# Patient Record
Sex: Female | Born: 1980 | Race: Black or African American | Hispanic: No | Marital: Married | Smoking: Never smoker
Health system: Southern US, Community
[De-identification: ages and names within clinical notes are randomized; demographics above are authoritative.]

## PROBLEM LIST (undated history)

## (undated) DIAGNOSIS — R319 Hematuria, unspecified: Secondary | ICD-10-CM

## (undated) DIAGNOSIS — M329 Systemic lupus erythematosus, unspecified: Secondary | ICD-10-CM

## (undated) DIAGNOSIS — K589 Irritable bowel syndrome without diarrhea: Secondary | ICD-10-CM

## (undated) DIAGNOSIS — IMO0002 Reserved for concepts with insufficient information to code with codable children: Secondary | ICD-10-CM

## (undated) HISTORY — PX: OOPHORECTOMY: SHX86

## (undated) HISTORY — PX: CHOLECYSTECTOMY: SHX55

## (undated) HISTORY — PX: ABDOMINAL HYSTERECTOMY: SHX81

---

## 2021-07-15 ENCOUNTER — Emergency Department (HOSPITAL_BASED_OUTPATIENT_CLINIC_OR_DEPARTMENT_OTHER): Payer: Self-pay

## 2021-07-15 ENCOUNTER — Emergency Department (HOSPITAL_BASED_OUTPATIENT_CLINIC_OR_DEPARTMENT_OTHER)
Admission: EM | Admit: 2021-07-15 | Discharge: 2021-07-16 | Disposition: A | Discharge status: Home / Self Care | Payer: Self-pay | Attending: Emergency Medicine | Admitting: Emergency Medicine

## 2021-07-15 ENCOUNTER — Other Ambulatory Visit: Payer: Self-pay

## 2021-07-15 ENCOUNTER — Encounter (HOSPITAL_BASED_OUTPATIENT_CLINIC_OR_DEPARTMENT_OTHER): Payer: Self-pay | Admitting: Emergency Medicine

## 2021-07-15 DIAGNOSIS — N3001 Acute cystitis with hematuria: Secondary | ICD-10-CM | POA: Insufficient documentation

## 2021-07-15 DIAGNOSIS — M329 Systemic lupus erythematosus, unspecified: Secondary | ICD-10-CM | POA: Insufficient documentation

## 2021-07-15 HISTORY — DX: Systemic lupus erythematosus, unspecified: M32.9

## 2021-07-15 HISTORY — DX: Reserved for concepts with insufficient information to code with codable children: IMO0002

## 2021-07-15 HISTORY — DX: Irritable bowel syndrome, unspecified: K58.9

## 2021-07-15 LAB — COMPREHENSIVE METABOLIC PANEL
ALT: 19 U/L (ref 0–44)
AST: 24 U/L (ref 15–41)
Albumin: 3.9 g/dL (ref 3.5–5.0)
Alkaline Phosphatase: 64 U/L (ref 38–126)
Anion gap: 9 (ref 5–15)
BUN: 7 mg/dL (ref 6–20)
CO2: 24 mmol/L (ref 22–32)
Calcium: 9.5 mg/dL (ref 8.9–10.3)
Chloride: 105 mmol/L (ref 98–111)
Creatinine, Ser: 0.8 mg/dL (ref 0.44–1.00)
GFR, Estimated: >60 mL/min (ref >60)
Glucose, Bld: 126 mg/dL — ABNORMAL HIGH (ref 70–99)
Potassium: 3.6 mmol/L (ref 3.5–5.1)
Sodium: 138 mmol/L (ref 135–145)
Total Bilirubin: 0.5 mg/dL (ref 0.3–1.2)
Total Protein: 8.4 g/dL — ABNORMAL HIGH (ref 6.5–8.1)

## 2021-07-15 LAB — CBC
HCT: 40.9 % (ref 36.0–46.0)
Hemoglobin: 14.8 g/dL (ref 12.0–15.0)
MCH: 29.2 pg (ref 26.0–34.0)
MCHC: 36.2 g/dL — ABNORMAL HIGH (ref 30.0–36.0)
MCV: 80.8 fL (ref 80.0–100.0)
Platelets: 401 10*3/uL — ABNORMAL HIGH (ref 150–400)
RBC: 5.06 MIL/uL (ref 3.87–5.11)
RDW: 14.6 % (ref 11.5–15.5)
WBC: 10 10*3/uL (ref 4.0–10.5)
nRBC: 0 % (ref 0.0–0.2)

## 2021-07-15 LAB — LIPASE, BLOOD: Lipase: 30 U/L (ref 11–51)

## 2021-07-15 MED ORDER — SODIUM CHLORIDE 0.9 % IV BOLUS
1000.0000 mL | Freq: Once | INTRAVENOUS | Status: AC
  Administered 2021-07-15: 1000 mL via INTRAVENOUS

## 2021-07-15 MED ORDER — SODIUM CHLORIDE 0.9 % IV BOLUS
1000.0000 mL | Freq: Once | INTRAVENOUS | Status: AC
  Administered 2021-07-16: 1000 mL via INTRAVENOUS

## 2021-07-15 MED ORDER — ONDANSETRON HCL 4 MG/2ML IJ SOLN
4.0000 mg | Freq: Once | INTRAMUSCULAR | Status: AC
  Administered 2021-07-16: 4 mg via INTRAVENOUS
  Filled 2021-07-15: qty 2

## 2021-07-15 MED ORDER — FENTANYL CITRATE PF 50 MCG/ML IJ SOSY
100.0000 ug | PREFILLED_SYRINGE | Freq: Once | INTRAMUSCULAR | Status: AC
  Administered 2021-07-16: 100 ug via INTRAVENOUS
  Filled 2021-07-15: qty 2

## 2021-07-15 NOTE — ED Triage Notes (Signed)
Reports she is having a lupus flair for the last four days.  Having  blood in urine.  Pain in joints, abdomen, and low back.  Taking tylenol and ibuprofen at home with no relief. ?

## 2021-07-16 ENCOUNTER — Telehealth: Payer: Self-pay

## 2021-07-16 LAB — URINALYSIS, ROUTINE W REFLEX MICROSCOPIC
Glucose, UA: NEGATIVE mg/dL
Ketones, ur: 15 mg/dL — AB
Nitrite: POSITIVE — AB
Protein, ur: >=300 mg/dL — AB
Specific Gravity, Urine: 1.015 (ref 1.005–1.030)
pH: 5 (ref 5.0–8.0)

## 2021-07-16 LAB — URINALYSIS, MICROSCOPIC (REFLEX): RBC / HPF: >50 RBC/hpf (ref 0–5)

## 2021-07-16 LAB — CK: Total CK: 52 U/L (ref 38–234)

## 2021-07-16 MED ORDER — CIPROFLOXACIN HCL 500 MG PO TABS
500.0000 mg | ORAL_TABLET | Freq: Two times a day (BID) | ORAL | 0 refills | Status: DC
Start: 2021-07-16 — End: 2021-09-24

## 2021-07-16 MED ORDER — CIPROFLOXACIN HCL 500 MG PO TABS
500.0000 mg | ORAL_TABLET | Freq: Once | ORAL | Status: AC
  Administered 2021-07-16: 500 mg via ORAL
  Filled 2021-07-16: qty 1

## 2021-07-16 MED ORDER — OXYCODONE-ACETAMINOPHEN 5-325 MG PO TABS
1.0000 | ORAL_TABLET | Freq: Once | ORAL | Status: AC
  Administered 2021-07-16: 1 via ORAL
  Filled 2021-07-16: qty 1

## 2021-07-16 MED ORDER — OXYCODONE-ACETAMINOPHEN 5-325 MG PO TABS: 1.0000 | ORAL_TABLET | Freq: Three times a day (TID) | ORAL | 0 refills | Status: DC | PRN

## 2021-07-16 NOTE — ED Provider Notes (Signed)
?[facility] EMERGENCY DEPARTMENT ?Provider Note ? ? ?CSN: [id number] ?Arrival date & time: 07/15/21  2042 ? ?  ? ?History ? ?Chief complaint - pain and weakness ? ? ?Amy Mathis is a 41 y.o. female. ? ?HPI ? ?  ?Patient with history of lupus and Crohn's presents with diffuse body pain and generalized weakness.  Patient reports she is having a lupus flare.  She reports for the past 4 days she has had generalized body aches and fatigue.  She reports joint joint pain without swelling.  No fevers.  She has had nausea with nonbloody emesis.  She also reports chronic diarrhea.  No chest pain but has felt short of breath.  This feels similar to prior episodes of a flare.  She also reports hematuria.  She reports she recently relocated here from [place].  Last admission to the hospital was over 6 months ago she reports she underwent a kidney biopsy ?Her only meds are Plaquenil and prednisone ?Home Medications ?Prior to Admission medications   ?Medication Sig Start Date End Date Taking? Authorizing Provider  ?ciprofloxacin (CIPRO) 500 MG tablet Take 1 tablet (500 mg total) by mouth every 12 (twelve) hours. 07/16/21  Yes Zadie Rhine, MD  ?oxyCODONE-acetaminophen (PERCOCET/ROXICET) 5-325 MG tablet Take 1 tablet by mouth every 8 (eight) hours as needed for severe pain. 07/16/21  Yes Zadie Rhine, MD  ?   ? ?Allergies    ?Morphine and Penicillins   ? ?Review of Systems   ?Review of Systems  ?Constitutional:  Positive for fatigue. Negative for fever.  ?Respiratory:  Positive for shortness of breath.   ?Cardiovascular:  Negative for chest pain.  ?Gastrointestinal:  Positive for nausea and vomiting.  ?Genitourinary:  Positive for hematuria.  ?Musculoskeletal:  Positive for arthralgias, back pain and myalgias.  ? ?Physical Exam ?Updated Vital Signs ?BP (!) 130/95   Pulse 79   Temp 98 ?F (36.7 ?C) (Oral)   Resp 14   Ht 1.803 m (5\' 11" )   Wt (!) 149.7 kg   SpO2 97%   BMI 46.03 kg/m?  ?Physical  Exam ?CONSTITUTIONAL: Well developed/well nourished ?HEAD: Normocephalic/atraumatic ?EYES: EOMI/PERRL ?ENMT: Mucous membranes moist ?NECK: supple no meningeal signs ?SPINE/BACK:entire spine nontender ?CV: S1/S2 noted, no murmurs/rubs/gallops noted ?LUNGS: Lungs are clear to auscultation bilaterally, no apparent distress ?ABDOMEN: soft, nontender, no rebound or guarding, bowel sounds noted throughout abdomen ?GU:no cva tenderness ?NEURO: Pt is awake/alert/appropriate, moves all extremitiesx4.  No facial droop.  No arm or leg drift ?EXTREMITIES: pulses normal/equal, full ROM ?SKIN: warm, color normal ?PSYCH: no abnormalities of mood noted, alert and oriented to situation ? ?ED Results / Procedures / Treatments   ?Labs ?(all labs ordered are listed, but only abnormal results are displayed) ?Labs Reviewed  ?COMPREHENSIVE METABOLIC PANEL - Abnormal; Notable for the following components:  ?    Result Value  ? Glucose, Bld 126 (*)   ? Total Protein 8.4 (*)   ? All other components within normal limits  ?CBC - Abnormal; Notable for the following components:  ? MCHC 36.2 (*)   ? Platelets 401 (*)   ? All other components within normal limits  ?URINALYSIS, ROUTINE W REFLEX MICROSCOPIC - Abnormal; Notable for the following components:  ? Color, Urine RED (*)   ? APPearance TURBID (*)   ? Hgb urine dipstick LARGE (*)   ? Bilirubin Urine MODERATE (*)   ? Ketones, ur 15 (*)   ? Protein, ur >=300 (*)   ? Nitrite POSITIVE (*)   ?  Leukocytes,Ua LARGE (*)   ? All other components within normal limits  ?URINALYSIS, MICROSCOPIC (REFLEX) - Abnormal; Notable for the following components:  ? Bacteria, UA MANY (*)   ? All other components within normal limits  ?URINE CULTURE  ?LIPASE, BLOOD  ?CK  ? ? ?EKG ?EKG Interpretation ? ?Date/Time:  Sunday July 15 2021 23:54:31 EDT ?Ventricular Rate:  86 ?PR Interval:  141 ?QRS Duration: 78 ?QT Interval:  363 ?QTC Calculation: 435 ?R Axis:   13 ?Text Interpretation: Sinus rhythm Low voltage,  precordial leads Borderline T abnormalities, anterior leads No previous ECGs available Confirmed by Zadie Rhine ([id number]) on 07/16/2021 12:06:39 AM ? ?Radiology ?DG Chest Portable 1 View ? ?Result Date: 07/16/2021 ?CLINICAL DATA:  Lupus flare x4 days. EXAM: PORTABLE CHEST 1 VIEW COMPARISON:  None. FINDINGS: The heart size and mediastinal contours are within normal limits. Both lungs are clear. The visualized skeletal structures are unremarkable. IMPRESSION: No active disease. Electronically Signed   By: Aram Candela M.D.   On: 07/16/2021 00:09   ? ?Procedures ?Procedures  ? ? ?Medications Ordered in ED ?Medications  ?oxyCODONE-acetaminophen (PERCOCET/ROXICET) 5-325 MG per tablet 1 tablet (has no administration in time range)  ?sodium chloride 0.9 % bolus 1,000 mL (1,000 mLs Intravenous New Bag/Given 07/16/21 0101)  ?fentaNYL (SUBLIMAZE) injection 100 mcg (100 mcg Intravenous Given 07/16/21 0008)  ?ondansetron [facility]) injection 4 mg (4 mg Intravenous Given 07/16/21 0007)  ?sodium chloride 0.9 % bolus 1,000 mL (1,000 mLs Intravenous New Bag/Given 07/15/21 2353)  ?ciprofloxacin (CIPRO) tablet 500 mg (500 mg Oral Given 07/16/21 0125)  ? ? ?ED Course/ Medical Decision Making/ A&P ?Clinical Course as of 07/16/21 0145  ?Sun Jul 15, 2021  ?2336 Glucose(!): 126 ?Mild hyperglycemia [DW]  ?Mon Jul 16, 2021  ?1308 Patient feeling improved and is ambulatory, labs pending at this time [DW]  ?6578 Patient with evidence of urinary tract infection.  However she is in no acute distress.  She is afebrile and without signs of sepsis.  No vomiting here.  She is safe for outpatient management [DW]  ?0145 Will be given referrals to PCP as she just relocated to [facility] and has multiple chronic conditions [DW]  ?0145 We discussed strict ER return precautions [DW]  ?0145 Patient request short course of pain medications for her lupus flare.  Patient has full range of motion of all her extremities, no obvious edema or effusions noted [DW]  ?   ?Clinical Course User Index ?[DW] Zadie Rhine, MD  ? ?                        ?Medical Decision Making ?Amount and/or Complexity of Data Reviewed ?Labs: ordered. Decision-making details documented in ED Course. ?Radiology: ordered. ?ECG/medicine tests: ordered. ? ?Risk ?Prescription drug management. ? ? ?This patient presents to the ED for concern of lupus flare, this involves an extensive number of treatment options, and is a complaint that carries with it a high risk of complications and morbidity.  The differential diagnosis includes but is not limited to dehydration, UTI, rhabdomyolysis ? ?Comorbidities that complicate the patient evaluation: ?Patient?s presentation is complicated by their history of lupus and Crohn's ? ?Social Determinants of Health: ?Patient?s lack of insurance and impaired access to primary care  increases the complexity of managing their presentation ? ?Additional history obtained: ?Additional history obtained from spouse ?No previous records and found ? ?Lab Tests: ?I Ordered, and personally interpreted labs.  The pertinent results include: hyper glycemia ? ?  Imaging Studies ordered: ?I ordered imaging studies including X-ray chest   ?I independently visualized and interpreted imaging which showed no acute findings ?I agree with the radiologist interpretation ? ?Cardiac Monitoring: ?The patient was maintained on a cardiac monitor.  I personally viewed and interpreted the cardiac monitor which showed an underlying rhythm of:  sinus rhythm ? ?Medicines ordered and prescription drug management: ?I ordered medication including IV fentanyl for pain ?Reevaluation of the patient after these medicines showed that the patient    improved ? ?Critical Interventions: ? ?Antibiotics and fluids ? ?Consultations Obtained: ?Consulted transitions of care to assist with PCP follow-up ? ?Reevaluation: ?After the interventions noted above, I reevaluated the patient and found that they have  :improved ? ?Complexity of problems addressed: ?Patient?s presentation is most consistent with  acute presentation with potential threat to life or bodily function ? ?Disposition: ?After consideration of the diagnostic results and t

## 2021-07-16 NOTE — Telephone Encounter (Signed)
RNCM spoke with patient re: need for PCP. Patient is new to area was seeing PCP in [place]. Patient is available anytime and has no insurance. RNCM will follow up tomorrow after 11am. ?

## 2021-07-17 ENCOUNTER — Telehealth: Payer: Self-pay

## 2021-07-17 LAB — URINE CULTURE: Culture: NO GROWTH

## 2021-07-17 NOTE — Telephone Encounter (Signed)
RNCM called patient to advise of new PCP appt scheduled 07/23/21 at 9:45am with $25 copay at [facility]. RNCM received no answer and unable to leave voicemail due to vm box has not been set up. RNCM will try again later. ?

## 2021-07-23 ENCOUNTER — Encounter: Payer: Self-pay | Admitting: Internal Medicine

## 2021-09-23 ENCOUNTER — Emergency Department (HOSPITAL_BASED_OUTPATIENT_CLINIC_OR_DEPARTMENT_OTHER)
Admission: EM | Admit: 2021-09-23 | Discharge: 2021-09-23 | Disposition: A | Discharge status: Home / Self Care | Payer: Self-pay | Attending: Emergency Medicine | Admitting: Emergency Medicine

## 2021-09-23 ENCOUNTER — Encounter (HOSPITAL_BASED_OUTPATIENT_CLINIC_OR_DEPARTMENT_OTHER): Payer: Self-pay | Admitting: Emergency Medicine

## 2021-09-23 ENCOUNTER — Emergency Department (HOSPITAL_BASED_OUTPATIENT_CLINIC_OR_DEPARTMENT_OTHER): Payer: Self-pay

## 2021-09-23 ENCOUNTER — Other Ambulatory Visit: Payer: Self-pay

## 2021-09-23 DIAGNOSIS — S63502A Unspecified sprain of left wrist, initial encounter: Secondary | ICD-10-CM | POA: Insufficient documentation

## 2021-09-23 DIAGNOSIS — W010XXA Fall on same level from slipping, tripping and stumbling without subsequent striking against object, initial encounter: Secondary | ICD-10-CM | POA: Insufficient documentation

## 2021-09-23 DIAGNOSIS — W19XXXA Unspecified fall, initial encounter: Secondary | ICD-10-CM

## 2021-09-23 MED ORDER — OXYCODONE-ACETAMINOPHEN 5-325 MG PO TABS
1.0000 | ORAL_TABLET | ORAL | Status: AC | PRN
  Administered 2021-09-23 (×2): 1 via ORAL
  Filled 2021-09-23 (×2): qty 1

## 2021-09-23 NOTE — ED Provider Notes (Signed)
  [facility] EMERGENCY DEPARTMENT Provider Note   CSN: [id number] Arrival date & time: 09/23/21  1618     History {Add pertinent medical, surgical, social history, OB history to HPI:1} Chief Complaint  Patient presents with   Amy Mathis is a 41 y.o. female.   Fall       Home Medications Prior to Admission medications   Medication Sig Start Date End Date Taking? Authorizing Provider  ciprofloxacin (CIPRO) 500 MG tablet Take 1 tablet (500 mg total) by mouth every 12 (twelve) hours. 07/16/21   Zadie Rhine, MD  oxyCODONE-acetaminophen (PERCOCET/ROXICET) 5-325 MG tablet Take 1 tablet by mouth every 8 (eight) hours as needed for severe pain. 07/16/21   Zadie Rhine, MD      Allergies    Morphine and Penicillins    Review of Systems   Review of Systems  Physical Exam Updated Vital Signs BP (!) 150/124 (BP Location: Right Arm)   Pulse (!) 111   Temp 99.6 F (37.6 C) (Oral)   Resp 18   Ht 5\' 10"  (1.778 m)   Wt 97.5 kg   SpO2 100%   BMI 30.85 kg/m  Physical Exam  ED Results / Procedures / Treatments   Labs (all labs ordered are listed, but only abnormal results are displayed) Labs Reviewed - No data to display  EKG None  Radiology DG Wrist Complete Left  Result Date: 09/23/2021 CLINICAL DATA:  Fall with left wrist pain. EXAM: LEFT WRIST - COMPLETE 3+ VIEW COMPARISON:  None Available. FINDINGS: There is no evidence of fracture or dislocation. There is no evidence of arthropathy or other focal bone abnormality. Soft tissues are unremarkable. IMPRESSION: Negative. Electronically Signed   By: 11/24/2021 M.D.   On: 09/23/2021 16:51    Procedures Procedures  {Document cardiac monitor, telemetry assessment procedure when appropriate:1}  Medications Ordered in ED Medications  oxyCODONE-acetaminophen (PERCOCET/ROXICET) 5-325 MG per tablet 1 tablet (1 tablet Oral Given 09/23/21 1632)    ED Course/ Medical Decision Making/ A&P                            Medical Decision Making Amount and/or Complexity of Data Reviewed Radiology: ordered.  Risk Prescription drug management.   ***  {Document critical care time when appropriate:1} {Document review of labs and clinical decision tools ie heart score, Chads2Vasc2 etc:1}  {Document your independent review of radiology images, and any outside records:1} {Document your discussion with family members, caretakers, and with consultants:1} {Document social determinants of health affecting pt's care:1} {Document your decision making why or why not admission, treatments were needed:1} Final Clinical Impression(s) / ED Diagnoses Final diagnoses:  Sprain of left wrist, initial encounter  Fall, initial encounter    Rx / DC Orders ED Discharge Orders     None

## 2021-09-23 NOTE — ED Triage Notes (Signed)
Pt arrives pov, steady gait, c/o mechanical fall 1 hr pta. Pt c/o left wrist pain, swelling noted Took tylenol @ 1500, and ibuprofen after fall 1 hr pta.

## 2021-09-23 NOTE — Discharge Instructions (Addendum)
You are seen the emergency department today after fall with wrist pain.  As we discussed your x-ray did not show any fractures.  I think that you likely sprained your wrist.  We have placed a splint on this.  We have given you a dose of some pain medication.  I recommend continuing ice and over-the-counter medications at home.  I have attached the contact information for the orthopedic doctor, for you to call and make a follow-up appointment if your symptoms do not improve.

## 2021-09-24 ENCOUNTER — Ambulatory Visit
Admission: EM | Admit: 2021-09-24 | Discharge: 2021-09-24 | Disposition: A | Discharge status: Home / Self Care | Payer: Self-pay

## 2021-09-24 DIAGNOSIS — S6992XD Unspecified injury of left wrist, hand and finger(s), subsequent encounter: Secondary | ICD-10-CM

## 2021-09-24 MED ORDER — DICLOFENAC SODIUM 1 % EX GEL: 4.0000 g | Freq: Four times a day (QID) | CUTANEOUS | 2 refills | Status: DC

## 2021-09-24 NOTE — Discharge Instructions (Signed)
Please continue Tylenol 1000 mg 3 times daily and begin Voltaren gel, applied 4 times daily as needed for pain.  Keep your wrist brace on and consider following up with [facility] at [facility] walk[facility] clinic.  They do not accept appointments for the urgent care clinic you does have to go in person, they are open from 8 AM to 8 PM Monday through Friday.  Thank you for visiting urgent care.

## 2021-09-24 NOTE — ED Triage Notes (Signed)
Pt states she has a fall yesterday (our of [employer] while wearing crocks) the patient states she hurt her left wrist. The patient has a brace around the wrist currently. She was seen in the ED yesterday. Patient states she has IBS so she cannot take NSAIDS.

## 2021-09-25 NOTE — ED Provider Notes (Signed)
[facility]    CSN: [id number] Arrival date & time: 09/24/21  1700    HISTORY   Chief Complaint  Patient presents with   Wrist Pain   HPI Amy Mathis is a 41 y.o. female. Patient states that she hurt her wrist yesterday after falling out of a [employer] while wearing crocs.  Patient states she went to the emergency room and was advised to take NSAIDs for pain, patient states she has lupus and IBS and cannot tolerate NSAIDs.  Patient states she is in a lot of pain today.  The emergency room provider placed her in a wrist splint and advised her that her wrist x-ray was negative.  Patient states she has fallen similarly in the past and broken her wrist before, believes it is broken at this time.  The history is provided by the patient.   Past Medical History:  Diagnosis Date   IBS (irritable bowel syndrome)    Lupus (HCC)    There are no problems to display for this patient.  Past Surgical History:  Procedure Laterality Date   ABDOMINAL HYSTERECTOMY     CHOLECYSTECTOMY     OOPHORECTOMY     OB History   No obstetric history on file.    Home Medications    Prior to Admission medications   Medication Sig Start Date End Date Taking? Authorizing Provider  diclofenac Sodium (VOLTAREN) 1 % GEL Apply 4 g topically 4 (four) times daily. Apply to affected areas 4 times daily as needed for pain. 09/24/21  Yes Theadora Rama Scales, PA-C  hydroxychloroquine (PLAQUENIL) 200 MG tablet Take by mouth 2 (two) times daily.   Yes [provider]  predniSONE (DELTASONE) 50 MG tablet Take 50 mg by mouth daily with breakfast.   Yes [provider]    Family History History reviewed. No pertinent family history. Social History Social History   Tobacco Use   Smoking status: Never   Smokeless tobacco: Never  Substance Use Topics   Alcohol use: Never   Drug use: Never   Allergies   Morphine and Penicillins  Review of Systems Review of Systems Pertinent  findings noted in history of present illness.   Physical Exam Triage Vital Signs ED Triage Vitals  Enc Vitals Group     BP 01/12/21 0827 (!) 147/82     Pulse Rate 01/12/21 0827 72     Resp 01/12/21 0827 18     Temp 01/12/21 0827 98.3 F (36.8 C)     Temp Source 01/12/21 0827 Oral     SpO2 01/12/21 0827 98 %     Weight --      Height --      Head Circumference --      Peak Flow --      Pain Score 01/12/21 0826 5     Pain Loc --      Pain Edu? --      Excl. in GC? --   No data found.  Updated Vital Signs BP 127/83 (BP Location: Right Arm)   Pulse 91   Temp 99 F (37.2 C) (Oral)   Resp 18   SpO2 97%   Physical Exam Vitals and nursing note reviewed.  Constitutional:      Appearance: Normal appearance.  HENT:     Head: Normocephalic and atraumatic.  Eyes:     Conjunctiva/sclera: Conjunctivae normal.  Pulmonary:     Effort: Pulmonary effort is normal. No respiratory distress.  Musculoskeletal:  Comments: Soft tissue swelling noted over the left wrist with limited ROM due to pain. Pain with thumb adduction. Able to wiggle other digits. Normal capillary refill.   Skin:    General: Skin is warm and dry.     Capillary Refill: Capillary refill takes less than 2 seconds.  Neurological:     Mental Status: She is alert.  Psychiatric:        Mood and Affect: Mood normal.        Behavior: Behavior normal.     Visual Acuity Right Eye Distance:   Left Eye Distance:   Bilateral Distance:    Right Eye Near:   Left Eye Near:    Bilateral Near:     [facility] Couse / Diagnostics / Procedures:    EKG  Radiology DG Wrist Complete Left  Result Date: 09/23/2021 CLINICAL DATA:  Fall with left wrist pain. EXAM: LEFT WRIST - COMPLETE 3+ VIEW COMPARISON:  None Available. FINDINGS: There is no evidence of fracture or dislocation. There is no evidence of arthropathy or other focal bone abnormality. Soft tissues are unremarkable. IMPRESSION: Negative. Electronically Signed   By: Kennith Center M.D.   On: 09/23/2021 16:51    Procedures Procedures (including critical care time)  UC Diagnoses / Final Clinical Impressions(s)   I have reviewed the triage vital signs and the nursing notes.  Pertinent labs & imaging results that were available during my care of the patient were reviewed by me and considered in my medical decision making (see chart for details).    Final diagnoses:  Injury of left wrist, subsequent encounter   Patient given contact information for [facility].  Patient provided with a prescription for diclofenac gel if she chooses not to go this evening.  ED Prescriptions     Medication Sig Dispense Auth. Provider   diclofenac Sodium (VOLTAREN) 1 % GEL Apply 4 g topically 4 (four) times daily. Apply to affected areas 4 times daily as needed for pain. 100 g Theadora Rama Scales, PA-C      PDMP not reviewed this encounter.  Pending results:  Labs Reviewed - No data to display  Medications Ordered in UC: Medications - No data to display  Disposition Upon Discharge:  Condition: stable for discharge home Home: take medications as prescribed; routine discharge instructions as discussed; follow up as advised.  Patient presented with an acute illness with associated systemic symptoms and significant discomfort requiring urgent management. In my opinion, this is a condition that a prudent lay person (someone who possesses an average knowledge of health and medicine) may potentially expect to result in complications if not addressed urgently such as respiratory distress, impairment of bodily function or dysfunction of bodily organs.   Routine symptom specific, illness specific and/or disease specific instructions were discussed with the patient and/or caregiver at length.   As such, the patient has been evaluated and assessed, work-up was performed and treatment was provided in alignment with urgent care protocols and evidence based  medicine.  Patient/parent/caregiver has been advised that the patient may require follow up for further testing and treatment if the symptoms continue in spite of treatment, as clinically indicated and appropriate.  Patient/parent/caregiver has been advised to return to the [facility] or PCP if no better; to PCP or the Emergency Department if new signs and symptoms develop, or if the current signs or symptoms continue to change or worsen for further workup, evaluation and treatment as clinically indicated and appropriate  The patient  will follow up with their current PCP if and as advised. If the patient does not currently have a PCP we will assist them in obtaining one.   The patient may need specialty follow up if the symptoms continue, in spite of conservative treatment and management, for further workup, evaluation, consultation and treatment as clinically indicated and appropriate.   Patient/parent/caregiver verbalized understanding and agreement of plan as discussed.  All questions were addressed during visit.  Please see discharge instructions below for further details of plan.  Discharge Instructions:   Discharge Instructions      Please continue Tylenol 1000 mg 3 times daily and begin Voltaren gel, applied 4 times daily as needed for pain.  Keep your wrist brace on and consider following up with [facility] at [facility] walk-[facility] clinic.  They do not accept appointments for the urgent care clinic you does have to go in person, they are open from 8 AM to 8 PM Monday through Friday.  Thank you for visiting urgent care.    This office note has been dictated using Museum/gallery curator.  Unfortunately, and despite my best efforts, this method of dictation can sometimes lead to occasional typographical or grammatical errors.  I apologize in advance if this occurs.     Lynden Oxford Scales, [place] 09/25/21 [phone]

## 2021-10-27 ENCOUNTER — Emergency Department (HOSPITAL_BASED_OUTPATIENT_CLINIC_OR_DEPARTMENT_OTHER)
Admission: EM | Admit: 2021-10-27 | Discharge: 2021-10-27 | Disposition: A | Discharge status: Home / Self Care | Payer: Self-pay | Attending: Emergency Medicine | Admitting: Emergency Medicine

## 2021-10-27 ENCOUNTER — Encounter (HOSPITAL_BASED_OUTPATIENT_CLINIC_OR_DEPARTMENT_OTHER): Payer: Self-pay | Admitting: Emergency Medicine

## 2021-10-27 ENCOUNTER — Other Ambulatory Visit: Payer: Self-pay

## 2021-10-27 DIAGNOSIS — L932 Other local lupus erythematosus: Secondary | ICD-10-CM | POA: Insufficient documentation

## 2021-10-27 DIAGNOSIS — E876 Hypokalemia: Secondary | ICD-10-CM | POA: Insufficient documentation

## 2021-10-27 DIAGNOSIS — R109 Unspecified abdominal pain: Secondary | ICD-10-CM | POA: Insufficient documentation

## 2021-10-27 DIAGNOSIS — R310 Gross hematuria: Secondary | ICD-10-CM | POA: Insufficient documentation

## 2021-10-27 LAB — COMPREHENSIVE METABOLIC PANEL
ALT: 16 U/L (ref 0–44)
AST: 18 U/L (ref 15–41)
Albumin: 3.9 g/dL (ref 3.5–5.0)
Alkaline Phosphatase: 57 U/L (ref 38–126)
Anion gap: 7 (ref 5–15)
BUN: 7 mg/dL (ref 6–20)
CO2: 27 mmol/L (ref 22–32)
Calcium: 9 mg/dL (ref 8.9–10.3)
Chloride: 103 mmol/L (ref 98–111)
Creatinine, Ser: 0.68 mg/dL (ref 0.44–1.00)
GFR, Estimated: >60 mL/min (ref >60)
Glucose, Bld: 93 mg/dL (ref 70–99)
Potassium: 3.2 mmol/L — ABNORMAL LOW (ref 3.5–5.1)
Sodium: 137 mmol/L (ref 135–145)
Total Bilirubin: 0.3 mg/dL (ref 0.3–1.2)
Total Protein: 8.5 g/dL — ABNORMAL HIGH (ref 6.5–8.1)

## 2021-10-27 LAB — URINALYSIS, ROUTINE W REFLEX MICROSCOPIC

## 2021-10-27 LAB — PREGNANCY, URINE: Preg Test, Ur: NEGATIVE

## 2021-10-27 LAB — CBC WITH DIFFERENTIAL/PLATELET
Abs Immature Granulocytes: 0.02 10*3/uL (ref 0.00–0.07)
Basophils Absolute: 0.1 10*3/uL (ref 0.0–0.1)
Basophils Relative: 1 %
Eosinophils Absolute: 0.2 10*3/uL (ref 0.0–0.5)
Eosinophils Relative: 2 %
HCT: 36.9 % (ref 36.0–46.0)
Hemoglobin: 13.5 g/dL (ref 12.0–15.0)
Immature Granulocytes: 0 %
Lymphocytes Relative: 38 %
Lymphs Abs: 3.9 10*3/uL (ref 0.7–4.0)
MCH: 30.3 pg (ref 26.0–34.0)
MCHC: 36.6 g/dL — ABNORMAL HIGH (ref 30.0–36.0)
MCV: 82.7 fL (ref 80.0–100.0)
Monocytes Absolute: 0.6 10*3/uL (ref 0.1–1.0)
Monocytes Relative: 6 %
Neutro Abs: 5.6 10*3/uL (ref 1.7–7.7)
Neutrophils Relative %: 53 %
Platelets: 385 10*3/uL (ref 150–400)
RBC: 4.46 MIL/uL (ref 3.87–5.11)
RDW: 14.1 % (ref 11.5–15.5)
WBC: 10.4 10*3/uL (ref 4.0–10.5)
nRBC: 0 % (ref 0.0–0.2)

## 2021-10-27 LAB — URINALYSIS, MICROSCOPIC (REFLEX): RBC / HPF: >50 RBC/hpf (ref 0–5)

## 2021-10-27 MED ORDER — HYDROMORPHONE HCL 1 MG/ML IJ SOLN
1.0000 mg | Freq: Once | INTRAMUSCULAR | Status: AC
  Administered 2021-10-27: 1 mg via INTRAVENOUS
  Filled 2021-10-27: qty 1

## 2021-10-27 MED ORDER — SODIUM CHLORIDE 0.9 % IV BOLUS
500.0000 mL | Freq: Once | INTRAVENOUS | Status: AC
  Administered 2021-10-27: 500 mL via INTRAVENOUS

## 2021-10-27 MED ORDER — ONDANSETRON HCL 4 MG/2ML IJ SOLN
4.0000 mg | Freq: Once | INTRAMUSCULAR | Status: AC
Start: 2021-10-27 — End: 2021-10-27
  Administered 2021-10-27: 4 mg via INTRAVENOUS
  Filled 2021-10-27: qty 2

## 2021-10-27 MED ORDER — FENTANYL CITRATE PF 50 MCG/ML IJ SOSY
50.0000 ug | PREFILLED_SYRINGE | Freq: Once | INTRAMUSCULAR | Status: AC
  Administered 2021-10-27: 50 ug via INTRAVENOUS
  Filled 2021-10-27: qty 1

## 2021-10-27 MED ORDER — POTASSIUM CHLORIDE CRYS ER 20 MEQ PO TBCR
40.0000 meq | EXTENDED_RELEASE_TABLET | Freq: Once | ORAL | Status: AC
  Administered 2021-10-27: 40 meq via ORAL
  Filled 2021-10-27: qty 2

## 2021-10-27 NOTE — ED Notes (Signed)
Pt is unable to provide a urine sample, will try to ask for a urine sample at a later time.

## 2021-10-27 NOTE — ED Provider Notes (Signed)
[facility] EMERGENCY DEPARTMENT Provider Note   CSN: [id number] Arrival date & time: 10/27/21  0008     History  Chief Complaint  Patient presents with   Lupus    Amy Mathis is a 41 y.o. female.  The history is provided by the patient and medical records.  Amy Mathis is a 41 y.o. female who presents to the Emergency Department complaining of lupus flare.  She presents to the ED for evaluation of bilateral flank pain that she describes as kidney pain from a lupus flare.  Drove here from [facility] yesterday and developed bilateral lower extremity edema, which has since improved.  Early yesterday morning had flank pain.  Around 7pm this evening started having bloody urination.  No dysuria.  No fever.  Vomited yesterday.  Nauseated yesterday, better today.  Has joint pain in hips.    Has a hx/o lupus nephritis.  Scheduled to see a doctor at [facility] August 28th.  Does not currently have a doctor.  Takes plaquenil and prednisone 50mg  daily for 1.5 years.    S/p hysterectomy, cholecystectomy.       Home Medications Prior to Admission medications   Medication Sig Start Date End Date Taking? Authorizing Provider  diclofenac Sodium (VOLTAREN) 1 % GEL Apply 4 g topically 4 (four) times daily. Apply to affected areas 4 times daily as needed for pain. 09/24/21   11/25/21 Scales, PA-C  hydroxychloroquine (PLAQUENIL) 200 MG tablet Take by mouth 2 (two) times daily.    [provider]  predniSONE (DELTASONE) 50 MG tablet Take 50 mg by mouth daily with breakfast.    [provider]      Allergies    Morphine and Penicillins    Review of Systems   Review of Systems  All other systems reviewed and are negative.   Physical Exam Updated Vital Signs BP (!) 124/57 (BP Location: Right Arm)   Pulse 90   Temp 98.8 F (37.1 C) (Oral)   Resp 18   Ht 5\' 10"  (1.778 m)   Wt (!) 145.2 kg   SpO2 100%   BMI 45.92 kg/m  Physical Exam Vitals and nursing note  reviewed.  Constitutional:      Appearance: She is well-developed.  HENT:     Head: Normocephalic and atraumatic.  Cardiovascular:     Rate and Rhythm: Normal rate and regular rhythm.     Heart sounds: No murmur heard. Pulmonary:     Effort: Pulmonary effort is normal. No respiratory distress.     Breath sounds: Normal breath sounds.  Abdominal:     Palpations: Abdomen is soft.     Tenderness: There is no abdominal tenderness. There is no guarding or rebound.  Musculoskeletal:        General: No swelling or tenderness.     Comments: 2+ DP pulses bilaterally  Skin:    General: Skin is warm and dry.  Neurological:     Mental Status: She is alert and oriented to person, place, and time.  Psychiatric:        Behavior: Behavior normal.     ED Results / Procedures / Treatments   Labs (all labs ordered are listed, but only abnormal results are displayed) Labs Reviewed  COMPREHENSIVE METABOLIC PANEL - Abnormal; Notable for the following components:      Result Value   Potassium 3.2 (*)    Total Protein 8.5 (*)    All other components within normal limits  CBC WITH DIFFERENTIAL/PLATELET -  Abnormal; Notable for the following components:   MCHC 36.6 (*)    All other components within normal limits  URINALYSIS, ROUTINE W REFLEX MICROSCOPIC - Abnormal; Notable for the following components:   Color, Urine RED (*)    APPearance TURBID (*)    Glucose, UA   (*)    Value: TEST NOT REPORTED DUE TO COLOR INTERFERENCE OF URINE PIGMENT   Hgb urine dipstick   (*)    Value: TEST NOT REPORTED DUE TO COLOR INTERFERENCE OF URINE PIGMENT   Bilirubin Urine   (*)    Value: TEST NOT REPORTED DUE TO COLOR INTERFERENCE OF URINE PIGMENT   Ketones, ur   (*)    Value: TEST NOT REPORTED DUE TO COLOR INTERFERENCE OF URINE PIGMENT   Protein, ur   (*)    Value: TEST NOT REPORTED DUE TO COLOR INTERFERENCE OF URINE PIGMENT   Nitrite   (*)    Value: TEST NOT REPORTED DUE TO COLOR INTERFERENCE OF URINE  PIGMENT   Leukocytes,Ua   (*)    Value: TEST NOT REPORTED DUE TO COLOR INTERFERENCE OF URINE PIGMENT   All other components within normal limits  URINALYSIS, MICROSCOPIC (REFLEX) - Abnormal; Notable for the following components:   Bacteria, UA FEW (*)    All other components within normal limits  URINE CULTURE  PREGNANCY, URINE    EKG None  Radiology No results found.  Procedures Procedures    Medications Ordered in ED Medications  fentaNYL (SUBLIMAZE) injection 50 mcg (50 mcg Intravenous Given 10/27/21 0148)  ondansetron (ZOFRAN) injection 4 mg (4 mg Intravenous Given 10/27/21 0145)  sodium chloride 0.9 % bolus 500 mL ( Intravenous Stopped 10/27/21 0246)  potassium chloride SA (KLOR-CON M) CR tablet 40 mEq (40 mEq Oral Given 10/27/21 0323)  HYDROmorphone (DILAUDID) injection 1 mg (1 mg Intravenous Given 10/27/21 0324)    ED Course/ Medical Decision Making/ A&P                           Medical Decision Making Amount and/or Complexity of Data Reviewed Labs: ordered.  Risk Prescription drug management.   Patient with history of lupus nephritis on chronic prednisone and Plaquenil therapy here for evaluation of bilateral flank pain, hematuria and joint pain.  She states this is similar to her prior lupus flares.  Urinalysis is very challenging to interpret due to squamous cells, blood present.  UA does not look overtly consistent with urinary tract infection.  We will send culture.  CBC is unremarkable.  BMP with normal renal function, mild hypokalemia.  Will replace orally.  Patient states that this is not consistent with her prior kidney stones, will defer imaging at this time.  Doubt acute renal vein thrombosis.  We will treat her pain in the emergency department with plan to discharge home.  She is scheduled to follow-up in the next 2 weeks, discussed reaching out to her doctor to see if they can move up this appointment sooner.  Discussed return precautions.        Final  Clinical Impression(s) / ED Diagnoses Final diagnoses:  Gross hematuria  Bilateral flank pain  Hypokalemia    Rx / DC Orders ED Discharge Orders     None         Tilden Fossa, MD 10/27/21 [phone]

## 2021-10-27 NOTE — ED Triage Notes (Signed)
Pt states lupus flare up, kidney pan, joint pain, blood in urine.

## 2021-10-28 ENCOUNTER — Encounter (HOSPITAL_BASED_OUTPATIENT_CLINIC_OR_DEPARTMENT_OTHER): Payer: Self-pay | Admitting: Emergency Medicine

## 2021-10-28 DIAGNOSIS — G8929 Other chronic pain: Secondary | ICD-10-CM | POA: Insufficient documentation

## 2021-10-28 DIAGNOSIS — R109 Unspecified abdominal pain: Secondary | ICD-10-CM | POA: Insufficient documentation

## 2021-10-28 DIAGNOSIS — M545 Low back pain, unspecified: Secondary | ICD-10-CM | POA: Insufficient documentation

## 2021-10-28 DIAGNOSIS — R310 Gross hematuria: Secondary | ICD-10-CM | POA: Insufficient documentation

## 2021-10-28 LAB — CBC WITH DIFFERENTIAL/PLATELET
Abs Immature Granulocytes: 0.03 10*3/uL (ref 0.00–0.07)
Basophils Absolute: 0.1 10*3/uL (ref 0.0–0.1)
Basophils Relative: 1 %
Eosinophils Absolute: 0.2 10*3/uL (ref 0.0–0.5)
Eosinophils Relative: 2 %
HCT: 36.5 % (ref 36.0–46.0)
Hemoglobin: 13.4 g/dL (ref 12.0–15.0)
Immature Granulocytes: 0 %
Lymphocytes Relative: 35 %
Lymphs Abs: 3.5 10*3/uL (ref 0.7–4.0)
MCH: 30.5 pg (ref 26.0–34.0)
MCHC: 36.7 g/dL — ABNORMAL HIGH (ref 30.0–36.0)
MCV: 83 fL (ref 80.0–100.0)
Monocytes Absolute: 0.6 10*3/uL (ref 0.1–1.0)
Monocytes Relative: 6 %
Neutro Abs: 5.6 10*3/uL (ref 1.7–7.7)
Neutrophils Relative %: 56 %
Platelets: 296 10*3/uL (ref 150–400)
RBC: 4.4 MIL/uL (ref 3.87–5.11)
RDW: 14.2 % (ref 11.5–15.5)
WBC: 10 10*3/uL (ref 4.0–10.5)
nRBC: 0 % (ref 0.0–0.2)

## 2021-10-28 LAB — URINE CULTURE

## 2021-10-28 LAB — COMPREHENSIVE METABOLIC PANEL
ALT: 13 U/L (ref 0–44)
AST: 22 U/L (ref 15–41)
Albumin: 3.6 g/dL (ref 3.5–5.0)
Alkaline Phosphatase: 50 U/L (ref 38–126)
Anion gap: 5 (ref 5–15)
BUN: 7 mg/dL (ref 6–20)
CO2: 26 mmol/L (ref 22–32)
Calcium: 8.5 mg/dL — ABNORMAL LOW (ref 8.9–10.3)
Chloride: 106 mmol/L (ref 98–111)
Creatinine, Ser: 0.89 mg/dL (ref 0.44–1.00)
GFR, Estimated: >60 mL/min (ref >60)
Glucose, Bld: 90 mg/dL (ref 70–99)
Potassium: 3.7 mmol/L (ref 3.5–5.1)
Sodium: 137 mmol/L (ref 135–145)
Total Bilirubin: 0.7 mg/dL (ref 0.3–1.2)
Total Protein: 7.9 g/dL (ref 6.5–8.1)

## 2021-10-28 NOTE — ED Triage Notes (Signed)
Lupus flare up kidney, joint pain, blood in urine. Seen 2 day ago for same. worse today.

## 2021-10-29 ENCOUNTER — Emergency Department (HOSPITAL_BASED_OUTPATIENT_CLINIC_OR_DEPARTMENT_OTHER)
Admission: EM | Admit: 2021-10-29 | Discharge: 2021-10-29 | Disposition: A | Discharge status: Home / Self Care | Payer: Self-pay | Attending: Emergency Medicine | Admitting: Emergency Medicine

## 2021-10-29 ENCOUNTER — Emergency Department (HOSPITAL_BASED_OUTPATIENT_CLINIC_OR_DEPARTMENT_OTHER): Payer: Self-pay

## 2021-10-29 DIAGNOSIS — R310 Gross hematuria: Secondary | ICD-10-CM

## 2021-10-29 DIAGNOSIS — G8929 Other chronic pain: Secondary | ICD-10-CM

## 2021-10-29 LAB — URINALYSIS, MICROSCOPIC (REFLEX): RBC / HPF: >50 RBC/hpf (ref 0–5)

## 2021-10-29 LAB — URINALYSIS, ROUTINE W REFLEX MICROSCOPIC

## 2021-10-29 MED ORDER — ONDANSETRON HCL 4 MG/2ML IJ SOLN
4.0000 mg | Freq: Once | INTRAMUSCULAR | Status: AC
  Administered 2021-10-29: 4 mg via INTRAVENOUS
  Filled 2021-10-29: qty 2

## 2021-10-29 MED ORDER — LACTATED RINGERS IV BOLUS
1000.0000 mL | Freq: Once | INTRAVENOUS | Status: AC
  Administered 2021-10-29: 1000 mL via INTRAVENOUS

## 2021-10-29 MED ORDER — NITROFURANTOIN MONOHYD MACRO 100 MG PO CAPS: 100.0000 mg | ORAL_CAPSULE | Freq: Two times a day (BID) | ORAL | 0 refills | Status: DC

## 2021-10-29 MED ORDER — HYDROCODONE-ACETAMINOPHEN 5-325 MG PO TABS: 1.0000 | ORAL_TABLET | Freq: Four times a day (QID) | ORAL | 0 refills | Status: DC | PRN

## 2021-10-29 MED ORDER — HYDROMORPHONE HCL 1 MG/ML IJ SOLN
1.0000 mg | Freq: Once | INTRAMUSCULAR | Status: AC
  Administered 2021-10-29: 1 mg via INTRAVENOUS
  Filled 2021-10-29: qty 1

## 2021-10-29 NOTE — ED Notes (Signed)
Patient transported to CT 

## 2021-10-29 NOTE — ED Notes (Signed)
Pt is aware we need a urine specimen, unable to void at this time Bilateral flank pain + nausea Was seen here recently for the same

## 2021-10-29 NOTE — ED Provider Notes (Signed)
[facility] EMERGENCY DEPARTMENT  Provider Note  CSN: [id number] Arrival date & time: 10/28/21 2130  History Chief Complaint  Patient presents with   Flank Pain   Hematuria    Amy Mathis is a 41 y.o. female with history of lupus presents to the ED for re-evaluation of continued back pain R>L and hematuria. Seen here 2 days ago for same. Bloodwork was unremarkable, UA with large blood, culture was inconclusive. She reports symptoms have not improved. She has had renal stones before but felt different than this. No fevers or joint complaints. Has had multiple prior ED visits for similar, in [place] and here. She is scheduled to establish with PCP later this month. Taking plaquenil and prednisone at home.    Home Medications Prior to Admission medications   Medication Sig Start Date End Date Taking? Authorizing Provider  HYDROcodone-acetaminophen (NORCO/VICODIN) 5-325 MG tablet Take 1 tablet by mouth every 6 (six) hours as needed for severe pain. 10/29/21  Yes Pollyann Savoy, MD  nitrofurantoin, macrocrystal-monohydrate, (MACROBID) 100 MG capsule Take 1 capsule (100 mg total) by mouth 2 (two) times daily. 10/29/21  Yes Pollyann Savoy, MD  diclofenac Sodium (VOLTAREN) 1 % GEL Apply 4 g topically 4 (four) times daily. Apply to affected areas 4 times daily as needed for pain. 09/24/21   Theadora Rama Scales, PA-C  hydroxychloroquine (PLAQUENIL) 200 MG tablet Take by mouth 2 (two) times daily.    [provider]  predniSONE (DELTASONE) 50 MG tablet Take 50 mg by mouth daily with breakfast.    [provider]     Allergies    Morphine and Penicillins   Review of Systems   Review of Systems Please see HPI for pertinent positives and negatives  Physical Exam BP 115/79   Pulse 74   Temp 98.4 F (36.9 C) (Oral)   Resp 18   Ht 5\' 10"  (1.778 m)   Wt (!) 145.2 kg   SpO2 96%   BMI 45.92 kg/m   Physical Exam Vitals and nursing note  reviewed.  Constitutional:      Appearance: Normal appearance.  HENT:     Head: Normocephalic and atraumatic.     Nose: Nose normal.     Mouth/Throat:     Mouth: Mucous membranes are moist.  Eyes:     Extraocular Movements: Extraocular movements intact.     Conjunctiva/sclera: Conjunctivae normal.  Cardiovascular:     Rate and Rhythm: Normal rate.  Pulmonary:     Effort: Pulmonary effort is normal.     Breath sounds: Normal breath sounds.  Abdominal:     General: Abdomen is flat.     Palpations: Abdomen is soft.     Tenderness: There is no abdominal tenderness.  Musculoskeletal:        General: Tenderness (R lumbar paraspinal muscles) present. No swelling. Normal range of motion.     Cervical back: Neck supple.  Skin:    General: Skin is warm and dry.  Neurological:     General: No focal deficit present.     Mental Status: She is alert.  Psychiatric:        Mood and Affect: Mood normal.     ED Results / Procedures / Treatments   EKG None  Procedures Procedures  Medications Ordered in the ED Medications  lactated ringers bolus 1,000 mL (0 mLs Intravenous Stopped 10/29/21 0210)  ondansetron (ZOFRAN) injection 4 mg (4 mg Intravenous Given 10/29/21 0101)  HYDROmorphone (DILAUDID) injection  1 mg (1 mg Intravenous Given 10/29/21 0102)  HYDROmorphone (DILAUDID) injection 1 mg (1 mg Intravenous Given 10/29/21 0228)    Initial Impression and Plan  Patient with continued pain, similar to previous lupus flares. Labs done in triage show no change in CBC or CMP, UA is pending. Given continued pain, will check CT to rule out renal stone. Pain/nausea meds and fluids for comfort.   ED Course   Clinical Course as of 10/29/21 1638  [facility] Oct 29, 2021  0131 I personally viewed the images from radiology studies and agree with radiologist interpretation: CT shows punctate renal stones but no ureteral stones to explain her pain.   [CS]  0300 UA continues to show gross hematuria. There is  21-50 WBC as well. Recent culture was inconclusive. Will treat empirically as her pain has been persistent.  [CS]    Clinical Course User Index [CS] Pollyann Savoy, MD     MDM Rules/Calculators/A&P Medical Decision Making Problems Addressed: Gross hematuria: chronic illness or injury Other chronic pain: chronic illness or injury  Amount and/or Complexity of Data Reviewed Labs: ordered. Decision-making details documented in ED Course. Radiology: ordered and independent interpretation performed.  Risk Prescription drug management.    Final Clinical Impression(s) / ED Diagnoses Final diagnoses:  Gross hematuria  Other chronic pain    Rx / DC Orders ED Discharge Orders          Ordered    nitrofurantoin, macrocrystal-monohydrate, (MACROBID) 100 MG capsule  2 times daily        10/29/21 0311    HYDROcodone-acetaminophen (NORCO/VICODIN) 5-325 MG tablet  Every 6 hours PRN        10/29/21 0311             Pollyann Savoy, MD 10/29/21 [phone]

## 2022-03-03 ENCOUNTER — Emergency Department (HOSPITAL_BASED_OUTPATIENT_CLINIC_OR_DEPARTMENT_OTHER)
Admission: EM | Admit: 2022-03-03 | Discharge: 2022-03-04 | Disposition: A | Discharge status: Home / Self Care | Payer: Medicaid Other | Attending: Emergency Medicine | Admitting: Emergency Medicine

## 2022-03-03 ENCOUNTER — Other Ambulatory Visit: Payer: Self-pay

## 2022-03-03 ENCOUNTER — Encounter (HOSPITAL_BASED_OUTPATIENT_CLINIC_OR_DEPARTMENT_OTHER): Payer: Self-pay | Admitting: Emergency Medicine

## 2022-03-03 ENCOUNTER — Emergency Department (HOSPITAL_BASED_OUTPATIENT_CLINIC_OR_DEPARTMENT_OTHER): Payer: Medicaid Other

## 2022-03-03 DIAGNOSIS — R1031 Right lower quadrant pain: Secondary | ICD-10-CM | POA: Insufficient documentation

## 2022-03-03 DIAGNOSIS — Z8739 Personal history of other diseases of the musculoskeletal system and connective tissue: Secondary | ICD-10-CM | POA: Diagnosis not present

## 2022-03-03 DIAGNOSIS — M329 Systemic lupus erythematosus, unspecified: Secondary | ICD-10-CM | POA: Diagnosis not present

## 2022-03-03 DIAGNOSIS — R109 Unspecified abdominal pain: Secondary | ICD-10-CM

## 2022-03-03 LAB — CBC WITH DIFFERENTIAL/PLATELET
Abs Immature Granulocytes: 0.05 10*3/uL (ref 0.00–0.07)
Basophils Absolute: 0.1 10*3/uL (ref 0.0–0.1)
Basophils Relative: 0 %
Eosinophils Absolute: 0.2 10*3/uL (ref 0.0–0.5)
Eosinophils Relative: 1 %
HCT: 38.9 % (ref 36.0–46.0)
Hemoglobin: 13.9 g/dL (ref 12.0–15.0)
Immature Granulocytes: 0 %
Lymphocytes Relative: 26 %
Lymphs Abs: 3.8 10*3/uL (ref 0.7–4.0)
MCH: 29.3 pg (ref 26.0–34.0)
MCHC: 35.7 g/dL (ref 30.0–36.0)
MCV: 81.9 fL (ref 80.0–100.0)
Monocytes Absolute: 0.8 10*3/uL (ref 0.1–1.0)
Monocytes Relative: 5 %
Neutro Abs: 10 10*3/uL — ABNORMAL HIGH (ref 1.7–7.7)
Neutrophils Relative %: 68 %
Platelets: 416 10*3/uL — ABNORMAL HIGH (ref 150–400)
RBC: 4.75 MIL/uL (ref 3.87–5.11)
RDW: 14.7 % (ref 11.5–15.5)
WBC: 14.9 10*3/uL — ABNORMAL HIGH (ref 4.0–10.5)
nRBC: 0 % (ref 0.0–0.2)

## 2022-03-03 LAB — URINALYSIS, ROUTINE W REFLEX MICROSCOPIC

## 2022-03-03 LAB — COMPREHENSIVE METABOLIC PANEL
ALT: 11 U/L (ref 0–44)
AST: 15 U/L (ref 15–41)
Albumin: 3.9 g/dL (ref 3.5–5.0)
Alkaline Phosphatase: 56 U/L (ref 38–126)
Anion gap: 7 (ref 5–15)
BUN: 11 mg/dL (ref 6–20)
CO2: 23 mmol/L (ref 22–32)
Calcium: 9.1 mg/dL (ref 8.9–10.3)
Chloride: 107 mmol/L (ref 98–111)
Creatinine, Ser: 0.91 mg/dL (ref 0.44–1.00)
GFR, Estimated: >60 mL/min (ref >60)
Glucose, Bld: 95 mg/dL (ref 70–99)
Potassium: 3.9 mmol/L (ref 3.5–5.1)
Sodium: 137 mmol/L (ref 135–145)
Total Bilirubin: 0.4 mg/dL (ref 0.3–1.2)
Total Protein: 8.5 g/dL — ABNORMAL HIGH (ref 6.5–8.1)

## 2022-03-03 LAB — LIPASE, BLOOD: Lipase: 31 U/L (ref 11–51)

## 2022-03-03 LAB — URINALYSIS, MICROSCOPIC (REFLEX): RBC / HPF: >50 RBC/hpf (ref 0–5)

## 2022-03-03 MED ORDER — OXYCODONE HCL 5 MG PO TABS: 5.0000 mg | ORAL_TABLET | ORAL | 0 refills | Status: DC | PRN

## 2022-03-03 MED ORDER — IOHEXOL 300 MG/ML  SOLN: 80.0000 mL | Freq: Once | INTRAMUSCULAR | Status: DC | PRN

## 2022-03-03 MED ORDER — LACTATED RINGERS IV BOLUS
1000.0000 mL | Freq: Once | INTRAVENOUS | Status: AC
  Administered 2022-03-03: 1000 mL via INTRAVENOUS

## 2022-03-03 MED ORDER — ONDANSETRON HCL 4 MG/2ML IJ SOLN
4.0000 mg | Freq: Once | INTRAMUSCULAR | Status: AC
  Administered 2022-03-03: 4 mg via INTRAVENOUS
  Filled 2022-03-03: qty 2

## 2022-03-03 MED ORDER — IOHEXOL 300 MG/ML  SOLN
100.0000 mL | Freq: Once | INTRAMUSCULAR | Status: AC | PRN
  Administered 2022-03-03: 100 mL via INTRAVENOUS

## 2022-03-03 MED ORDER — HYDROMORPHONE HCL 1 MG/ML IJ SOLN
1.0000 mg | Freq: Once | INTRAMUSCULAR | Status: AC
  Administered 2022-03-03: 1 mg via INTRAVENOUS
  Filled 2022-03-03: qty 1

## 2022-03-03 NOTE — Discharge Instructions (Signed)
You were seen for your flank pain.  You had a CT scan that was reassuring.  Your urine showed blood in it.  It is likely that your symptoms are from lupus nephritis.  Please follow-up with a primary doctor and rheumatologist about your symptoms.  Take Tylenol and the oxycodone we have given you for your pain.  Return immediately if you experience fevers, worsening pain, or any other concerning symptoms.  Please check your MyChart for any pending results.

## 2022-03-03 NOTE — ED Notes (Signed)
Pt aware of need for urine sample, stated she didn't need to go .

## 2022-03-03 NOTE — ED Triage Notes (Signed)
Pt reports RT flank pain w/ radiation to RLQ since yesterday, worsening today

## 2022-03-03 NOTE — ED Provider Notes (Signed)
  [facility] EMERGENCY DEPARTMENT Provider Note   CSN: [id number] Arrival date & time: 03/03/22  2119     History {Add pertinent medical, surgical, social history, OB history to HPI:1} Chief Complaint  Patient presents with   Flank Pain    Amy Mathis is a 41 y.o. female.  Yesterday tgwaked back  Getting worse R flank radiates to rlq Sharp and squeazing pain Has sle and stones Urinating blood since yesterday, +frequency but no urgency, gb and hysterectomy out, still has appendix, sharp and squezing pain Nausea without vom, no diarreha Taking tylenol but unsure of fevers        Home Medications Prior to Admission medications   Medication Sig Start Date End Date Taking? Authorizing Provider  diclofenac Sodium (VOLTAREN) 1 % GEL Apply 4 g topically 4 (four) times daily. Apply to affected areas 4 times daily as needed for pain. 09/24/21   Theadora Rama Scales, PA-C  HYDROcodone-acetaminophen (NORCO/VICODIN) 5-325 MG tablet Take 1 tablet by mouth every 6 (six) hours as needed for severe pain. 10/29/21   Pollyann Savoy, MD  hydroxychloroquine (PLAQUENIL) 200 MG tablet Take by mouth 2 (two) times daily.    [provider]  nitrofurantoin, macrocrystal-monohydrate, (MACROBID) 100 MG capsule Take 1 capsule (100 mg total) by mouth 2 (two) times daily. 10/29/21   Pollyann Savoy, MD  predniSONE (DELTASONE) 50 MG tablet Take 50 mg by mouth daily with breakfast.    [provider]      Allergies    Morphine and Penicillins    Review of Systems   Review of Systems  Physical Exam Updated Vital Signs BP (!) 134/101 (BP Location: Right Arm)   Pulse (!) 120   Temp 98.4 F (36.9 C) (Oral)   Resp (!) 22   Ht 5\' 11"  (1.803 m)   Wt (!) 145.2 kg   SpO2 100%   BMI 44.63 kg/m  Physical Exam  ED Results / Procedures / Treatments   Labs (all labs ordered are listed, but only abnormal results are displayed) Labs Reviewed  URINALYSIS, ROUTINE W  REFLEX MICROSCOPIC    EKG None  Radiology No results found.  Procedures Procedures  {Document cardiac monitor, telemetry assessment procedure when appropriate:1}  Medications Ordered in ED Medications - No data to display  ED Course/ Medical Decision Making/ A&P                           Medical Decision Making Amount and/or Complexity of Data Reviewed Labs: ordered.   ***  {Document critical care time when appropriate:1} {Document review of labs and clinical decision tools ie heart score, Chads2Vasc2 etc:1}  {Document your independent review of radiology images, and any outside records:1} {Document your discussion with family members, caretakers, and with consultants:1} {Document social determinants of health affecting pt's care:1} {Document your decision making why or why not admission, treatments were needed:1} Final Clinical Impression(s) / ED Diagnoses Final diagnoses:  None    Rx / DC Orders ED Discharge Orders     None

## 2022-03-05 LAB — URINE CULTURE: Culture: NO GROWTH

## 2022-03-17 ENCOUNTER — Other Ambulatory Visit: Payer: Self-pay

## 2022-03-17 ENCOUNTER — Encounter (HOSPITAL_BASED_OUTPATIENT_CLINIC_OR_DEPARTMENT_OTHER): Payer: Self-pay

## 2022-03-17 DIAGNOSIS — R1030 Lower abdominal pain, unspecified: Secondary | ICD-10-CM | POA: Insufficient documentation

## 2022-03-17 DIAGNOSIS — R109 Unspecified abdominal pain: Secondary | ICD-10-CM | POA: Diagnosis present

## 2022-03-17 DIAGNOSIS — R1031 Right lower quadrant pain: Secondary | ICD-10-CM | POA: Diagnosis not present

## 2022-03-17 DIAGNOSIS — G8929 Other chronic pain: Secondary | ICD-10-CM | POA: Insufficient documentation

## 2022-03-17 DIAGNOSIS — R310 Gross hematuria: Secondary | ICD-10-CM | POA: Insufficient documentation

## 2022-03-17 NOTE — ED Triage Notes (Signed)
C/o right flank pain. Hematuria, pain when starting stream.  Hx of lupus

## 2022-03-18 ENCOUNTER — Emergency Department (HOSPITAL_BASED_OUTPATIENT_CLINIC_OR_DEPARTMENT_OTHER)
Admission: EM | Admit: 2022-03-18 | Discharge: 2022-03-18 | Disposition: A | Discharge status: Home / Self Care | Payer: Medicaid Other | Attending: Emergency Medicine | Admitting: Emergency Medicine

## 2022-03-18 DIAGNOSIS — G8929 Other chronic pain: Secondary | ICD-10-CM

## 2022-03-18 DIAGNOSIS — R310 Gross hematuria: Secondary | ICD-10-CM

## 2022-03-18 MED ORDER — HYDROMORPHONE HCL 1 MG/ML IJ SOLN
1.0000 mg | Freq: Once | INTRAMUSCULAR | Status: AC
  Administered 2022-03-18: 1 mg via INTRAVENOUS
  Filled 2022-03-18: qty 1

## 2022-03-18 MED ORDER — OXYCODONE HCL 5 MG PO TABS: 5.0000 mg | ORAL_TABLET | ORAL | 0 refills | Status: DC | PRN

## 2022-03-18 NOTE — ED Provider Notes (Signed)
[facility] EMERGENCY DEPARTMENT  Provider Note  CSN: [id number] Arrival date & time: 03/17/22 1854  History Chief Complaint  Patient presents with   Flank Pain    Amy Mathis is a 42 y.o. female with history of SLE with frequent ED visits for flank pain and hematuria reports 2 days of same. She denies fever. She has had numerous labs, urine cultures and CT scans without definite cause. She moved from [facility] a number of months ago and does not have insurance here.    Home Medications Prior to Admission medications   Medication Sig Start Date End Date Taking? Authorizing Provider  diclofenac Sodium (VOLTAREN) 1 % GEL Apply 4 g topically 4 (four) times daily. Apply to affected areas 4 times daily as needed for pain. 09/24/21   Lynden Oxford Scales, PA-C  hydroxychloroquine (PLAQUENIL) 200 MG tablet Take by mouth 2 (two) times daily.    [provider]  nitrofurantoin, macrocrystal-monohydrate, (MACROBID) 100 MG capsule Take 1 capsule (100 mg total) by mouth 2 (two) times daily. 10/29/21   Truddie Hidden, MD  oxyCODONE (ROXICODONE) 5 MG immediate release tablet Take 1 tablet (5 mg total) by mouth every 4 (four) hours as needed for severe pain. 03/18/22   Truddie Hidden, MD  predniSONE (DELTASONE) 50 MG tablet Take 50 mg by mouth daily with breakfast.    [provider]     Allergies    Morphine and Penicillins   Review of Systems   Review of Systems Please see HPI for pertinent positives and negatives  Physical Exam BP 110/76 (BP Location: Right Arm)   Pulse 81   Temp 97.9 F (36.6 C) (Oral)   Resp 16   Ht 5\' 10"  (1.778 m)   Wt (!) 148.8 kg   SpO2 97%   BMI 47.06 kg/m   Physical Exam Vitals and nursing note reviewed.  Constitutional:      Appearance: Normal appearance.  HENT:     Head: Normocephalic and atraumatic.     Nose: Nose normal.     Mouth/Throat:     Mouth: Mucous membranes are moist.  Eyes:     Extraocular Movements:  Extraocular movements intact.     Conjunctiva/sclera: Conjunctivae normal.  Cardiovascular:     Rate and Rhythm: Normal rate.  Pulmonary:     Effort: Pulmonary effort is normal.     Breath sounds: Normal breath sounds.  Abdominal:     General: Abdomen is flat.     Palpations: Abdomen is soft.     Tenderness: There is abdominal tenderness (mild suprapubic).  Musculoskeletal:        General: No swelling. Normal range of motion.     Cervical back: Neck supple.  Skin:    General: Skin is warm and dry.  Neurological:     General: No focal deficit present.     Mental Status: She is alert.  Psychiatric:        Mood and Affect: Mood normal.     ED Results / Procedures / Treatments   EKG None  Procedures Procedures  Medications Ordered in the ED Medications  HYDROmorphone (DILAUDID) injection 1 mg (1 mg Intravenous Given 03/18/22 0321)    Initial Impression and Plan  Patient here with exacerbation of her chronic flank pain and hematuria. She reports when she is not having pain, her urine is clear. Today she provided a sample that is only blood clot. No urine.Will check bladder scan to ensure no outlet obstruction.  Pain medication for comfort. No indication to repeat labs, has had normal renal function at several recent ED visits.   ED Course   Clinical Course as of 03/18/22 0348  Mon Mar 18, 2022  8185 Bladder scan is negative. Patient's pain improved. She was advised there are no further diagnostic tests available in the ED to evaluate her symptoms. She has had difficulty getting follow up locally due to lack of insurance. She has been referred to Rheum at [facility] after a visit to [facility] recently. I recommend she contact both Rheum and Urology there as they may be more able to manage her conditions and/or willing to see her without insurance.  [CS]    Clinical Course User Index [CS] Truddie Hidden, MD     MDM Rules/Calculators/A&P Medical Decision Making Problems  Addressed: Chronic flank pain: chronic illness or injury with exacerbation, progression, or side effects of treatment Gross hematuria: chronic illness or injury with exacerbation, progression, or side effects of treatment  Risk Prescription drug management.    Final Clinical Impression(s) / ED Diagnoses Final diagnoses:  Chronic flank pain  Gross hematuria    Rx / DC Orders ED Discharge Orders          Ordered    oxyCODONE (ROXICODONE) 5 MG immediate release tablet  Every 4 hours PRN        03/18/22 0347             Truddie Hidden, MD 03/18/22 [phone]

## 2022-03-29 ENCOUNTER — Encounter: Payer: Self-pay | Admitting: *Deleted

## 2022-04-11 ENCOUNTER — Other Ambulatory Visit: Payer: Self-pay

## 2022-04-11 ENCOUNTER — Emergency Department (HOSPITAL_BASED_OUTPATIENT_CLINIC_OR_DEPARTMENT_OTHER): Payer: Medicaid Other

## 2022-04-11 ENCOUNTER — Emergency Department (HOSPITAL_BASED_OUTPATIENT_CLINIC_OR_DEPARTMENT_OTHER)
Admission: EM | Admit: 2022-04-11 | Discharge: 2022-04-11 | Disposition: A | Discharge status: Home / Self Care | Payer: Medicaid Other | Attending: Emergency Medicine | Admitting: Emergency Medicine

## 2022-04-11 DIAGNOSIS — M329 Systemic lupus erythematosus, unspecified: Secondary | ICD-10-CM

## 2022-04-11 DIAGNOSIS — R1031 Right lower quadrant pain: Secondary | ICD-10-CM | POA: Diagnosis not present

## 2022-04-11 DIAGNOSIS — R310 Gross hematuria: Secondary | ICD-10-CM | POA: Diagnosis not present

## 2022-04-11 DIAGNOSIS — M3210 Systemic lupus erythematosus, organ or system involvement unspecified: Secondary | ICD-10-CM | POA: Insufficient documentation

## 2022-04-11 DIAGNOSIS — R109 Unspecified abdominal pain: Secondary | ICD-10-CM

## 2022-04-11 DIAGNOSIS — G894 Chronic pain syndrome: Secondary | ICD-10-CM | POA: Diagnosis not present

## 2022-04-11 LAB — BASIC METABOLIC PANEL
Anion gap: 9 (ref 5–15)
BUN: 9 mg/dL (ref 6–20)
CO2: 23 mmol/L (ref 22–32)
Calcium: 9.3 mg/dL (ref 8.9–10.3)
Chloride: 104 mmol/L (ref 98–111)
Creatinine, Ser: 0.86 mg/dL (ref 0.44–1.00)
GFR, Estimated: >60 mL/min (ref >60)
Glucose, Bld: 95 mg/dL (ref 70–99)
Potassium: 3.8 mmol/L (ref 3.5–5.1)
Sodium: 136 mmol/L (ref 135–145)

## 2022-04-11 LAB — URINALYSIS, MICROSCOPIC (REFLEX): RBC / HPF: >50 RBC/hpf (ref 0–5)

## 2022-04-11 LAB — URINALYSIS, ROUTINE W REFLEX MICROSCOPIC
Glucose, UA: NEGATIVE mg/dL
Ketones, ur: NEGATIVE mg/dL
Leukocytes,Ua: NEGATIVE
Nitrite: NEGATIVE
Protein, ur: 100 mg/dL — AB
Specific Gravity, Urine: >=1.03 (ref 1.005–1.030)
pH: 5 (ref 5.0–8.0)

## 2022-04-11 LAB — CBC
HCT: 40 % (ref 36.0–46.0)
Hemoglobin: 14.5 g/dL (ref 12.0–15.0)
MCH: 29.3 pg (ref 26.0–34.0)
MCHC: 36.3 g/dL — ABNORMAL HIGH (ref 30.0–36.0)
MCV: 80.8 fL (ref 80.0–100.0)
Platelets: 384 10*3/uL (ref 150–400)
RBC: 4.95 MIL/uL (ref 3.87–5.11)
RDW: 13.9 % (ref 11.5–15.5)
WBC: 11.2 10*3/uL — ABNORMAL HIGH (ref 4.0–10.5)
nRBC: 0 % (ref 0.0–0.2)

## 2022-04-11 MED ORDER — KETOROLAC TROMETHAMINE 15 MG/ML IJ SOLN
15.0000 mg | Freq: Once | INTRAMUSCULAR | Status: AC
  Administered 2022-04-11: 15 mg via INTRAVENOUS
  Filled 2022-04-11: qty 1

## 2022-04-11 MED ORDER — SODIUM CHLORIDE 0.9 % IV BOLUS
1000.0000 mL | Freq: Once | INTRAVENOUS | Status: AC
  Administered 2022-04-11: 1000 mL via INTRAVENOUS

## 2022-04-11 MED ORDER — OXYCODONE HCL 5 MG PO TABS: 5.0000 mg | ORAL_TABLET | Freq: Four times a day (QID) | ORAL | 0 refills | Status: AC | PRN

## 2022-04-11 MED ORDER — ONDANSETRON HCL 4 MG/2ML IJ SOLN
4.0000 mg | Freq: Once | INTRAMUSCULAR | Status: AC
  Administered 2022-04-11: 4 mg via INTRAVENOUS
  Filled 2022-04-11: qty 2

## 2022-04-11 MED ORDER — HYDROMORPHONE HCL 1 MG/ML IJ SOLN
2.0000 mg | Freq: Once | INTRAMUSCULAR | Status: AC
  Administered 2022-04-11: 2 mg via INTRAVENOUS
  Filled 2022-04-11: qty 2

## 2022-04-11 MED ORDER — PREDNISONE 10 MG PO TABS: 50.0000 mg | ORAL_TABLET | Freq: Every day | ORAL | 0 refills | Status: AC

## 2022-04-11 NOTE — ED Triage Notes (Signed)
Pt with right flank pain, states it is her kidney,  Hx of lupus and of similar pain and kidney stones in the past.  Symptoms began Monday.

## 2022-04-11 NOTE — ED Notes (Signed)
Patient stated that they are not able to urinate at this time

## 2022-04-11 NOTE — ED Provider Notes (Cosign Needed Addendum)
[facility] Provider Note   CSN: [id number] Arrival date & time: 04/11/22  1610     History  Chief Complaint  Patient presents with   Flank Pain    Amy Mathis is a 42 y.o. female with past medical history of lupus and IBD who presents to the ED complaining of severe right flank pain that started 3 days ago with nausea and vomiting.  Patient denies fever, chills, chest pain, shortness of breath, dysuria or hematuria, vaginal bleeding or discharge, or other complaints. Reports difficulty tolerating PO secondary to nausea. Last episode of vomiting was yesterday. She is not taking any anti-emetics at home. She has taken Tylenol at home for pain with no relief. She reports a history of kidney stones with similar pain. No hematemesis, hematochezia, or melena. Diarrhea consistent with her baseline history of IBD. Prior abdominal surgeries include hysterectomy, oophorectomy, and cholecystectomy. She denies vaginal bleeding, vaginal discharge, pelvic pain, or concern for STD exposure.      Home Medications Prior to Admission medications   Medication Sig Start Date End Date Taking? Authorizing Provider  oxyCODONE (ROXICODONE) 5 MG immediate release tablet Take 1 tablet (5 mg total) by mouth every 6 (six) hours as needed for up to 3 days for severe pain. 04/11/22 04/14/22 Yes Antavia Tandy L, PA-C  predniSONE (DELTASONE) 10 MG tablet Take 5 tablets (50 mg total) by mouth daily for 14 days. 04/11/22 04/25/22 Yes Neema Fluegge L, PA-C  diclofenac Sodium (VOLTAREN) 1 % GEL Apply 4 g topically 4 (four) times daily. Apply to affected areas 4 times daily as needed for pain. 09/24/21   Lynden Oxford Scales, PA-C  hydroxychloroquine (PLAQUENIL) 200 MG tablet Take by mouth 2 (two) times daily.    [provider]  nitrofurantoin, macrocrystal-monohydrate, (MACROBID) 100 MG capsule Take 1 capsule (100 mg total) by mouth 2 (two) times daily. 10/29/21    Truddie Hidden, MD      Allergies    Morphine and Penicillins    Review of Systems   Review of Systems  Constitutional:  Positive for appetite change. Negative for activity change, chills, fever and unexpected weight change.  HENT:  Negative for congestion, ear pain, rhinorrhea and sore throat.   Eyes:  Negative for pain and visual disturbance.  Respiratory:  Negative for cough, chest tightness, shortness of breath and wheezing.   Cardiovascular:  Negative for chest pain, palpitations and leg swelling.  Gastrointestinal:  Positive for diarrhea, nausea and vomiting. Negative for abdominal distention, abdominal pain, anal bleeding, blood in stool, constipation and rectal pain.  Genitourinary:  Positive for flank pain. Negative for decreased urine volume, difficulty urinating, dysuria, urgency, vaginal bleeding, vaginal discharge and vaginal pain.  Musculoskeletal:  Negative for arthralgias, back pain and joint swelling.  Skin:  Negative for color change and rash.  Neurological:  Negative for dizziness, seizures, syncope, weakness, light-headedness and headaches.  All other systems reviewed and are negative.   Physical Exam Updated Vital Signs BP (!) 133/90   Pulse 72   Temp 98.2 F (36.8 C) (Oral)   Resp 15   SpO2 100%  Physical Exam Vitals and nursing note reviewed.  Constitutional:      General: She is in acute distress (moderate secondary to pain).     Appearance: Normal appearance. She is not toxic-appearing.     Comments: Appears uncomfortable, rocking back and forth  HENT:     Head: Normocephalic and atraumatic.     Mouth/Throat:  Mouth: Mucous membranes are moist.  Eyes:     General: No scleral icterus.    Extraocular Movements: Extraocular movements intact.     Conjunctiva/sclera: Conjunctivae normal.  Cardiovascular:     Rate and Rhythm: Normal rate and regular rhythm.     Heart sounds: No murmur heard. Pulmonary:     Effort: Pulmonary effort is normal. No  respiratory distress.     Breath sounds: Normal breath sounds. No wheezing, rhonchi or rales.  Abdominal:     General: Abdomen is flat.     Palpations: Abdomen is soft.     Tenderness: There is abdominal tenderness (RLQ, mild). There is right CVA tenderness (moderate). There is no left CVA tenderness, guarding or rebound.     Comments: Abdomen soft, remainder of abdomen non-tender  Musculoskeletal:        General: No tenderness. Normal range of motion.     Cervical back: Normal range of motion and neck supple.     Right lower leg: No edema.     Left lower leg: No edema.  Skin:    General: Skin is warm and dry.     Capillary Refill: Capillary refill takes less than 2 seconds.     Coloration: Skin is not jaundiced or pale.  Neurological:     Mental Status: She is alert. Mental status is at baseline.  Psychiatric:        Mood and Affect: Affect is tearful.        Speech: Speech normal.        Behavior: Behavior is cooperative.     ED Results / Procedures / Treatments   Labs (all labs ordered are listed, but only abnormal results are displayed) Labs Reviewed  URINALYSIS, ROUTINE W REFLEX MICROSCOPIC - Abnormal; Notable for the following components:      Result Value   Color, Urine RED (*)    APPearance CLOUDY (*)    Hgb urine dipstick LARGE (*)    Bilirubin Urine SMALL (*)    Protein, ur 100 (*)    All other components within normal limits  CBC - Abnormal; Notable for the following components:   WBC 11.2 (*)    MCHC 36.3 (*)    All other components within normal limits  URINALYSIS, MICROSCOPIC (REFLEX) - Abnormal; Notable for the following components:   Bacteria, UA RARE (*)    All other components within normal limits  BASIC METABOLIC PANEL    EKG None  Radiology CT ABDOMEN PELVIS WO CONTRAST  Result Date: 04/11/2022 CLINICAL DATA:  Abdominal and flank pain EXAM: CT ABDOMEN AND PELVIS WITHOUT CONTRAST TECHNIQUE: Multidetector CT imaging of the abdomen and pelvis was  performed following the standard protocol without IV contrast. RADIATION DOSE REDUCTION: This exam was performed according to the departmental dose-optimization program which includes automated exposure control, adjustment of the mA and/or kV according to patient size and/or use of iterative reconstruction technique. COMPARISON:  CT abdomen and pelvis 03/03/2022 FINDINGS: Lower chest: No acute abnormality. Hepatobiliary: No focal liver abnormality is seen. Status post cholecystectomy. No biliary dilatation. Pancreas: Unremarkable. No pancreatic ductal dilatation or surrounding inflammatory changes. Spleen: Normal in size without focal abnormality. Adrenals/Urinary Tract: There is a punctate nonobstructing calculus in the inferior pole of the left kidney. Otherwise, the kidneys, adrenal glands and bladder are within normal limits. Stomach/Bowel: Stomach is within normal limits. Appendix appears normal. No evidence of bowel wall thickening, distention, or inflammatory changes. Vascular/Lymphatic: No significant vascular findings are present. No enlarged abdominal  or pelvic lymph nodes. Reproductive: Status post hysterectomy. No adnexal masses. Other: No abdominal wall hernia or abnormality. No abdominopelvic ascites. Musculoskeletal: No acute or significant osseous findings. IMPRESSION: 1. No acute localizing process in the abdomen or pelvis. 2. Nonobstructing left renal calculus. 3. Status post cholecystectomy and hysterectomy. Electronically Signed   By: Ronney Asters M.D.   On: 04/11/2022 17:58    Procedures Procedures   Medications Ordered in ED Medications  HYDROmorphone (DILAUDID) injection 2 mg (2 mg Intravenous Given 04/11/22 1746)  ketorolac (TORADOL) 15 MG/ML injection 15 mg (15 mg Intravenous Given 04/11/22 1747)  sodium chloride 0.9 % bolus 1,000 mL (0 mLs Intravenous Stopped 04/11/22 1850)  ondansetron (ZOFRAN) injection 4 mg (4 mg Intravenous Given 04/11/22 1746)  HYDROmorphone (DILAUDID)  injection 2 mg (2 mg Intravenous Given 04/11/22 2048)  ondansetron (ZOFRAN) injection 4 mg (4 mg Intravenous Given 04/11/22 2048)    ED Course/ Medical Decision Making/ A&P                           Medical Decision Making Amount and/or Complexity of Data Reviewed Labs: ordered. Decision-making details documented in ED Course. Radiology: ordered. Decision-making details documented in ED Course.  Risk Prescription drug management.   This is a 42 year old female with PMH IBS, lupus with poorly controlled chronic pain, presenting to ED for evaluation of acute worsening right flank pain. The causes of flank/abdominal pain include but are not limited to nephrolithiasis, ureterolithiasis with or without obstruction, lupus nephritis, gastroenteritis, peritonitis, constipation, cystitis, pyelonephritis, SBO/LBO, IBD, IBS, or hepatitis, PID. Pt very uncomfortable appearing on initial exam and with this, proceeded with workup as above for further evaluation. 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 She states in the past when she had better control of her lupus under the care of rheumatology she had very rare flare-ups of her pain. She notes compliance with Plaquenil and daily dose of prednisone at 50mg  which was prescribed by her previous rheumatologist in [facility]. On arrival, pt was tachycardic and tachypneic which had resolved by my initial exam, I suspect this was likely secondary to pain. Otherwise, vital signs remained reassuring with no significantly elevated blood pressure and pt was afebrile. Mild leukocytosis on labs but otherwise CBC and metabolic panel unremarkable. With tachycardia and report of difficulty tolerating PO, fluid bolus was administered as well as Zofran, Toradol, and Dilaudid for pain control. Pt did  require redosing of Dilaudid x 1 for adequate pain control and on multiple re-examinations following this was significantly improved with soft, nontender abdomen, resolved tachycardia and tachypnea, tolerating PO, and overall well-appearing. CTAP unrevealing for acute finding to explain pt's symptoms. I suspect symptoms likely related to acute exacerbation of her chronic pain secondary to lupus nephritis. UA positive for gross hematuria so otherwise difficult to analyze though does not appear acute infected. With these findings, a normal kidney function, well-controlled blood pressure, and well controlled pain on re-examination pt stable for discharge and strongly encouraged outpatient follow up with primary care and rheumatology which pt/partner agree to arrange as soon as possible. Pt states she has been placed on waitlist for rheumatology clinic she was previously referred to and otherwise has an appointment in late February if one does not become available before that time. Pt requesting pain  medication for home. On my initial assessment, pt did appear to have severe pain and examination findings were not distractible. Pt stated last prescription for pain medication had to come out of ED due to lack of established outpatient care here post recent move. I reviewed PDMP and this was last prescription for small amount of oxycodone on 1/1 and overall she has not had alarming number of ED visits since her move 3 months ago considering her unmanaged chronic conditions. Discussed with pt I will provide her with small supply of pain medication for 3 days and that otherwise providing more out of the ED setting is inappropriate and she should not expect this if she continues to make future visits. Pt and partner expressed understanding of this Pt also states she is running out of prednisone which she has been on for 10+ years as prescribed by out of state rheumatologist. I will provide her with short supply of this while  she awaits outpatient follow up. All questions answered, strict return precautions given, stable for discharge.    Final Clinical Impression(s) / ED Diagnoses Final diagnoses:  Right flank pain  Lupus ([place])  Chronic pain syndrome  Gross hematuria    Rx / DC Orders ED Discharge Orders          Ordered    oxyCODONE (ROXICODONE) 5 MG immediate release tablet  Every 6 hours PRN        04/11/22 2158    predniSONE (DELTASONE) 10 MG tablet  Daily        04/11/22 2158              Suzzette Righter, PA-C 04/13/22 1250    Turner Daniels 04/13/22 1250    Tretha Sciara, MD 04/15/22 1724

## 2022-04-11 NOTE — ED Notes (Signed)
1 attempt at blood draw Patient stated that they are usually require IV ultrasound

## 2022-04-11 NOTE — ED Notes (Signed)
Pt states she is unable to provide urine sample at this time. Pt reminded of need for urine sample to determine disposition; pt voiced understanding, states she "will try again in a bit."

## 2022-04-11 NOTE — Discharge Instructions (Signed)
Thank you for letting us take care of you today.  We did not find an acute cause of your pain today and I suspect that you are likely suffering from chronic pain due to your history of lupus.  We were able to get your pain under control using IV medications in the ED today.  As you are having such profound difficulty establishing primary care as well as getting rheumatology follow-up, I provided a small amount of pain medication for you to take at home.  Please take this medication only as needed and sparingly.  We are unable to prescribe long-term prescriptions for pain medication out of the ED.  Please continue to contact the rheumatology office your previously referred to and get an appointment to be seen as soon as possible.  As you also indicated that you are almost out of your prednisone previously prescribed by her out-of-state rheumatologist, I provided short term prescription at the same dose of this medication.  Please only take this once daily.  If you develop worsening symptoms, fever, chest pain, severe abdominal pain, inability to eat or drink, inability to urinate, or any other new concerns, please return to the emergency department for reevaluation.

## 2022-04-16 ENCOUNTER — Encounter (HOSPITAL_BASED_OUTPATIENT_CLINIC_OR_DEPARTMENT_OTHER): Payer: Self-pay | Admitting: Emergency Medicine

## 2022-04-16 ENCOUNTER — Emergency Department (HOSPITAL_BASED_OUTPATIENT_CLINIC_OR_DEPARTMENT_OTHER)
Admission: EM | Admit: 2022-04-16 | Discharge: 2022-04-16 | Disposition: A | Discharge status: Home / Self Care | Payer: Medicaid Other | Attending: Emergency Medicine | Admitting: Emergency Medicine

## 2022-04-16 ENCOUNTER — Other Ambulatory Visit: Payer: Self-pay

## 2022-04-16 DIAGNOSIS — R109 Unspecified abdominal pain: Secondary | ICD-10-CM | POA: Diagnosis present

## 2022-04-16 DIAGNOSIS — E876 Hypokalemia: Secondary | ICD-10-CM | POA: Insufficient documentation

## 2022-04-16 DIAGNOSIS — G8929 Other chronic pain: Secondary | ICD-10-CM | POA: Insufficient documentation

## 2022-04-16 DIAGNOSIS — R310 Gross hematuria: Secondary | ICD-10-CM | POA: Diagnosis not present

## 2022-04-16 DIAGNOSIS — R1031 Right lower quadrant pain: Secondary | ICD-10-CM | POA: Diagnosis not present

## 2022-04-16 LAB — URINALYSIS, ROUTINE W REFLEX MICROSCOPIC

## 2022-04-16 LAB — COMPREHENSIVE METABOLIC PANEL
ALT: 15 U/L (ref 0–44)
AST: 15 U/L (ref 15–41)
Albumin: 3.7 g/dL (ref 3.5–5.0)
Alkaline Phosphatase: 55 U/L (ref 38–126)
Anion gap: 8 (ref 5–15)
BUN: 17 mg/dL (ref 6–20)
CO2: 24 mmol/L (ref 22–32)
Calcium: 9 mg/dL (ref 8.9–10.3)
Chloride: 106 mmol/L (ref 98–111)
Creatinine, Ser: 0.94 mg/dL (ref 0.44–1.00)
GFR, Estimated: >60 mL/min (ref >60)
Glucose, Bld: 96 mg/dL (ref 70–99)
Potassium: 3.2 mmol/L — ABNORMAL LOW (ref 3.5–5.1)
Sodium: 138 mmol/L (ref 135–145)
Total Bilirubin: 0.4 mg/dL (ref 0.3–1.2)
Total Protein: 8.3 g/dL — ABNORMAL HIGH (ref 6.5–8.1)

## 2022-04-16 LAB — CBC
HCT: 38.1 % (ref 36.0–46.0)
Hemoglobin: 13.7 g/dL (ref 12.0–15.0)
MCH: 29 pg (ref 26.0–34.0)
MCHC: 36 g/dL (ref 30.0–36.0)
MCV: 80.7 fL (ref 80.0–100.0)
Platelets: 396 10*3/uL (ref 150–400)
RBC: 4.72 MIL/uL (ref 3.87–5.11)
RDW: 14 % (ref 11.5–15.5)
WBC: 15.4 10*3/uL — ABNORMAL HIGH (ref 4.0–10.5)
nRBC: 0 % (ref 0.0–0.2)

## 2022-04-16 LAB — PREGNANCY, URINE: Preg Test, Ur: NEGATIVE

## 2022-04-16 LAB — LIPASE, BLOOD: Lipase: 33 U/L (ref 11–51)

## 2022-04-16 LAB — URINALYSIS, MICROSCOPIC (REFLEX): RBC / HPF: >50 RBC/hpf (ref 0–5)

## 2022-04-16 MED ORDER — OXYCODONE HCL 5 MG PO TABS: 5.0000 mg | ORAL_TABLET | Freq: Four times a day (QID) | ORAL | 0 refills | Status: DC | PRN

## 2022-04-16 MED ORDER — FENTANYL CITRATE PF 50 MCG/ML IJ SOSY
100.0000 ug | PREFILLED_SYRINGE | Freq: Once | INTRAMUSCULAR | Status: AC
  Administered 2022-04-16: 100 ug via INTRAVENOUS
  Filled 2022-04-16: qty 2

## 2022-04-16 MED ORDER — FENTANYL CITRATE PF 50 MCG/ML IJ SOSY
50.0000 ug | PREFILLED_SYRINGE | Freq: Once | INTRAMUSCULAR | Status: AC
  Administered 2022-04-16: 50 ug via INTRAVENOUS
  Filled 2022-04-16: qty 1

## 2022-04-16 MED ORDER — SODIUM CHLORIDE 0.9 % IV BOLUS
1000.0000 mL | Freq: Once | INTRAVENOUS | Status: AC
  Administered 2022-04-16: 1000 mL via INTRAVENOUS

## 2022-04-16 MED ORDER — OXYCODONE HCL 5 MG PO TABS
10.0000 mg | ORAL_TABLET | Freq: Once | ORAL | Status: AC
  Administered 2022-04-16: 10 mg via ORAL
  Filled 2022-04-16: qty 2

## 2022-04-16 MED ORDER — KETOROLAC TROMETHAMINE 15 MG/ML IJ SOLN
15.0000 mg | Freq: Once | INTRAMUSCULAR | Status: AC
  Administered 2022-04-16: 15 mg via INTRAVENOUS
  Filled 2022-04-16: qty 1

## 2022-04-16 MED ORDER — ONDANSETRON HCL 4 MG/2ML IJ SOLN
4.0000 mg | Freq: Once | INTRAMUSCULAR | Status: AC
  Administered 2022-04-16: 4 mg via INTRAVENOUS
  Filled 2022-04-16: qty 2

## 2022-04-16 NOTE — ED Notes (Signed)
Bladder scan was 0ml. 

## 2022-04-16 NOTE — ED Triage Notes (Signed)
Pt with right flank pain x 2 days.  Seen for same on 1/25 and reports it was suspected that this was a flare of her lupus.  Was told to come back if worse. Initially improved but then worsened and she presents for eval.

## 2022-04-16 NOTE — ED Provider Notes (Signed)
[place] [facility] Provider Note   CSN: [id number] Arrival date & time: 04/16/22  1450     History  Chief Complaint  Patient presents with   Flank Pain    Amy Mathis is a 42 y.o. female.  Patient with history of lupus on Plaquenil and prednisone, hematuria, chronic flank pain --presents to the emergency department today for worsening of the right-sided flank pain.  She was most recently seen in the emergency department on 04/11/2022 and had negative imaging at that time.  Pain is in the lateral abdomen and radiates around the flank into the back.  She continues to have hematuria.  Pain is similar in character but she states it is worse today than it has been in the past.  She states that she could not even sit up to do her daughter's hair this morning.  No reported fevers, nausea, vomiting, diarrhea.  No vaginal bleeding or discharge.  She has a history of cholecystectomy and hysterectomy.  She has been taking prescribed oxycodone at home for pain.  She reports history of cystoscopy and renal biopsy.       Home Medications Prior to Admission medications   Medication Sig Start Date End Date Taking? Authorizing Provider  diclofenac Sodium (VOLTAREN) 1 % GEL Apply 4 g topically 4 (four) times daily. Apply to affected areas 4 times daily as needed for pain. 09/24/21   Lynden Oxford Scales, PA-C  hydroxychloroquine (PLAQUENIL) 200 MG tablet Take by mouth 2 (two) times daily.    [provider]  nitrofurantoin, macrocrystal-monohydrate, (MACROBID) 100 MG capsule Take 1 capsule (100 mg total) by mouth 2 (two) times daily. 10/29/21   Truddie Hidden, MD  predniSONE (DELTASONE) 10 MG tablet Take 5 tablets (50 mg total) by mouth daily for 14 days. 04/11/22 04/25/22  Gowens, Alver Fisher L, PA-C      Allergies    Morphine and Penicillins    Review of Systems   Review of Systems  Physical Exam Updated Vital Signs BP (!) 141/115 (BP Location:  Right Arm)   Pulse (!) 108   Temp 98 F (36.7 C) (Oral)   Resp 20   SpO2 100%   Physical Exam Vitals and nursing note reviewed.  Constitutional:      General: She is in acute distress (Appears uncomfortable).     Appearance: She is well-developed.  HENT:     Head: Normocephalic and atraumatic.     Right Ear: External ear normal.     Left Ear: External ear normal.     Nose: Nose normal.     Mouth/Throat:     Mouth: Mucous membranes are moist.  Eyes:     Conjunctiva/sclera: Conjunctivae normal.  Cardiovascular:     Rate and Rhythm: Regular rhythm. Tachycardia present.     Heart sounds: No murmur heard. Pulmonary:     Effort: No respiratory distress.     Breath sounds: No wheezing, rhonchi or rales.  Abdominal:     Palpations: Abdomen is soft.     Tenderness: There is abdominal tenderness. There is no guarding or rebound.     Comments: Patient tender to palpation over the right lower quadrant, right flank and mid to lower back.  No rebound or guarding.  Musculoskeletal:     Cervical back: Normal range of motion and neck supple.     Right lower leg: No edema.     Left lower leg: No edema.  Skin:    General: Skin  is warm and dry.     Findings: No rash.  Neurological:     General: No focal deficit present.     Mental Status: She is alert. Mental status is at baseline.     Motor: No weakness.  Psychiatric:        Mood and Affect: Mood normal.     ED Results / Procedures / Treatments   Labs (all labs ordered are listed, but only abnormal results are displayed) Labs Reviewed  COMPREHENSIVE METABOLIC PANEL - Abnormal; Notable for the following components:      Result Value   Potassium 3.2 (*)    Total Protein 8.3 (*)    All other components within normal limits  CBC - Abnormal; Notable for the following components:   WBC 15.4 (*)    All other components within normal limits  URINALYSIS, ROUTINE W REFLEX MICROSCOPIC - Abnormal; Notable for the following components:    Color, Urine RED (*)    APPearance HAZY (*)    Glucose, UA   (*)    Value: TEST NOT REPORTED DUE TO COLOR INTERFERENCE OF URINE PIGMENT   Hgb urine dipstick   (*)    Value: TEST NOT REPORTED DUE TO COLOR INTERFERENCE OF URINE PIGMENT   Bilirubin Urine   (*)    Value: TEST NOT REPORTED DUE TO COLOR INTERFERENCE OF URINE PIGMENT   Ketones, ur   (*)    Value: TEST NOT REPORTED DUE TO COLOR INTERFERENCE OF URINE PIGMENT   Protein, ur   (*)    Value: TEST NOT REPORTED DUE TO COLOR INTERFERENCE OF URINE PIGMENT   Nitrite   (*)    Value: TEST NOT REPORTED DUE TO COLOR INTERFERENCE OF URINE PIGMENT   Leukocytes,Ua   (*)    Value: TEST NOT REPORTED DUE TO COLOR INTERFERENCE OF URINE PIGMENT   All other components within normal limits  URINALYSIS, MICROSCOPIC (REFLEX) - Abnormal; Notable for the following components:   Bacteria, UA RARE (*)    All other components within normal limits  LIPASE, BLOOD  PREGNANCY, URINE    EKG None  Radiology No results found.  Procedures Procedures    Medications Ordered in ED Medications  fentaNYL (SUBLIMAZE) injection 50 mcg (50 mcg Intravenous Given 04/16/22 1537)  ketorolac (TORADOL) 15 MG/ML injection 15 mg (15 mg Intravenous Given 04/16/22 1536)  ondansetron (ZOFRAN) injection 4 mg (4 mg Intravenous Given 04/16/22 1536)  sodium chloride 0.9 % bolus 1,000 mL (0 mLs Intravenous Stopped 04/16/22 1646)  fentaNYL (SUBLIMAZE) injection 100 mcg (100 mcg Intravenous Given 04/16/22 1701)  oxyCODONE (Oxy IR/ROXICODONE) immediate release tablet 10 mg (10 mg Oral Given 04/16/22 1805)    ED Course/ Medical Decision Making/ A&P    Patient seen and examined. History obtained directly from patient.  Reviewed previous ED visits and imaging results.  Labs/EKG: Ordered CBC, CMP, lipase, UA, urine pregnancy.  Imaging: None ordered  Medications/Fluids: Ordered: IV fentanyl, IV Toradol, IV Zofran, fluid bolus.   Most recent vital signs reviewed and are as  follows: BP (!) 141/115 (BP Location: Right Arm)   Pulse (!) 108   Temp 98 F (36.7 C) (Oral)   Resp 20   SpO2 100%   Initial impression: Acute on chronic flank pain in setting of lupus  5:51 PM Reassessment performed. Patient appears more comfortable after second dose of fentanyl.  Labs personally reviewed and interpreted including:  Imaging personally visualized and interpreted including: DBC with white blood cell count 15.4 normal  hemoglobin; CMP with mild hypokalemia at 3.2 otherwise unremarkable; lipase normal.  Patient unable to urinate yet.  She has received IV fluids.  Reviewed pertinent lab work and imaging with patient at bedside. Questions answered.   Most current vital signs reviewed and are as follows: BP (!) 138/127   Pulse 69   Temp 98 F (36.7 C) (Oral)   Resp 20   SpO2 99%   Plan: Will reassess.  6:58 PM Reassessment performed. Patient appears more comfortable now.  She states that the oral medications helped better than the IV medications.  She did get good news from her husband who called and stated that she has an appointment scheduled with rheumatology next Monday.  Patient encouraged to keep this appointment.  Labs personally reviewed and interpreted including: UA with gross blood, negative pregnancy.  Reviewed pertinent lab work and imaging with patient at bedside. Questions answered.   Most current vital signs reviewed and are as follows: BP (!) 137/94   Pulse 71   Temp 98 F (36.7 C) (Oral)   Resp 19   SpO2 100%   Plan: Discharge to home   Prescriptions written for: Oxycodone 5 mg # 8 tablets  Other home care instructions discussed: Follow-up with PCP  ED return instructions discussed: The patient was urged to return to the Emergency Department immediately with worsening of current symptoms, worsening abdominal pain, persistent vomiting, blood noted in stools, fever, or any other concerns. The patient verbalized understanding.   Follow-up  instructions discussed: Patient encouraged to follow-up with their PCP in 3-5 days.                             Medical Decision Making Amount and/or Complexity of Data Reviewed Labs: ordered.  Risk Prescription drug management.   For this patient's complaint of abdominal pain, the following conditions were considered on the differential diagnosis: gastritis/PUD, enteritis/duodenitis, appendicitis, cholelithiasis/cholecystitis, cholangitis, pancreatitis, ruptured viscus, colitis, diverticulitis, small/large bowel obstruction, proctitis, cystitis, pyelonephritis, ureteral colic, aortic dissection, aortic aneurysm. In women, ectopic pregnancy, pelvic inflammatory disease, ovarian cysts, and tubo-ovarian abscess were also considered. Atypical chest etiologies were also considered including ACS, PE, and pneumonia.  Hypokalemia: mild, recheck by PCP and maintain normal diet  The patient's vital signs, pertinent lab work and imaging were reviewed and interpreted as discussed in the ED course. Hospitalization was considered for further testing, treatments, or serial exams/observation. However as patient is well-appearing, has a stable exam, and reassuring studies today, I do not feel that they warrant admission at this time. This plan was discussed with the patient who verbalizes agreement and comfort with this plan and seems reliable and able to return to the Emergency Department with worsening or changing symptoms.          Final Clinical Impression(s) / ED Diagnoses Final diagnoses:  Chronic right flank pain  Gross hematuria    Rx / DC Orders ED Discharge Orders          Ordered    oxyCODONE (OXY IR/ROXICODONE) 5 MG immediate release tablet  Every 6 hours PRN        04/16/22 1857              Carlisle Cater, Hershal Coria 04/16/22 1904    Margette Fast, MD 04/16/22 [phone]

## 2022-04-16 NOTE — Discharge Instructions (Signed)
Please read and follow all provided instructions.  Your diagnoses today include:  1. Chronic right flank pain   2. Gross hematuria     Tests performed today include: Blood cell counts and platelets: Stress cells are little bit high Kidney and liver function tests: Kidney function is still normal Pancreas function test (called lipase) Urine test to look for infection: Continues to show a lot of blood A blood or urine test for pregnancy (women only) Vital signs. See below for your results today.   Medications prescribed:  Oxycodone - narcotic pain medication  DO NOT drive or perform any activities that require you to be awake and alert because this medicine can make you drowsy.   Take any prescribed medications only as directed.  Home care instructions:  Follow any educational materials contained in this packet.  Follow-up instructions: Please follow-up with your rheumatology appointment as planned.  Return instructions:  SEEK IMMEDIATE MEDICAL ATTENTION IF: The pain does not go away or becomes severe  A temperature above 101F develops  Repeated vomiting occurs (multiple episodes)  The pain becomes localized to portions of the abdomen. The right side could possibly be appendicitis. In an adult, the left lower portion of the abdomen could be colitis or diverticulitis.  Blood is being passed in stools or vomit (bright red or black tarry stools)  You develop chest pain, difficulty breathing, dizziness or fainting, or become confused, poorly responsive, or inconsolable (young children) If you have any other emergent concerns regarding your health  Additional Information: Abdominal (belly) pain can be caused by many things. Your caregiver performed an examination and possibly ordered blood/urine tests and imaging (CT scan, x-rays, ultrasound). Many cases can be observed and treated at home after initial evaluation in the emergency department. Even though you are being discharged home,  abdominal pain can be unpredictable. Therefore, you need a repeated exam if your pain does not resolve, returns, or worsens. Most patients with abdominal pain don't have to be admitted to the hospital or have surgery, but serious problems like appendicitis and gallbladder attacks can start out as nonspecific pain. Many abdominal conditions cannot be diagnosed in one visit, so follow-up evaluations are very important.  Your vital signs today were: BP (!) 137/94   Pulse 71   Temp 98 F (36.7 C) (Oral)   Resp 19   SpO2 100%  If your blood pressure (bp) was elevated above 135/85 this visit, please have this repeated by your doctor within one month. --------------

## 2022-04-18 ENCOUNTER — Emergency Department (HOSPITAL_BASED_OUTPATIENT_CLINIC_OR_DEPARTMENT_OTHER)
Admission: EM | Admit: 2022-04-18 | Discharge: 2022-04-19 | Disposition: A | Discharge status: Home / Self Care | Payer: Medicaid Other | Attending: Emergency Medicine | Admitting: Emergency Medicine

## 2022-04-18 ENCOUNTER — Encounter (HOSPITAL_BASED_OUTPATIENT_CLINIC_OR_DEPARTMENT_OTHER): Payer: Self-pay

## 2022-04-18 DIAGNOSIS — R300 Dysuria: Secondary | ICD-10-CM | POA: Diagnosis not present

## 2022-04-18 DIAGNOSIS — R319 Hematuria, unspecified: Secondary | ICD-10-CM | POA: Insufficient documentation

## 2022-04-18 DIAGNOSIS — D72829 Elevated white blood cell count, unspecified: Secondary | ICD-10-CM | POA: Diagnosis not present

## 2022-04-18 DIAGNOSIS — R109 Unspecified abdominal pain: Secondary | ICD-10-CM | POA: Diagnosis not present

## 2022-04-18 LAB — URINALYSIS, MICROSCOPIC (REFLEX)
Bacteria, UA: NONE SEEN
RBC / HPF: >50 RBC/hpf (ref 0–5)
Squamous Epithelial / HPF: NONE SEEN /HPF (ref 0–5)
WBC, UA: NONE SEEN WBC/hpf (ref 0–5)

## 2022-04-18 LAB — URINALYSIS, ROUTINE W REFLEX MICROSCOPIC: pH: 5.5 (ref 5.0–8.0)

## 2022-04-18 LAB — CBC
HCT: 37.8 % (ref 36.0–46.0)
Hemoglobin: 13.4 g/dL (ref 12.0–15.0)
MCH: 28.8 pg (ref 26.0–34.0)
MCHC: 35.4 g/dL (ref 30.0–36.0)
MCV: 81.3 fL (ref 80.0–100.0)
Platelets: 353 10*3/uL (ref 150–400)
RBC: 4.65 MIL/uL (ref 3.87–5.11)
RDW: 14.2 % (ref 11.5–15.5)
WBC: 13.3 10*3/uL — ABNORMAL HIGH (ref 4.0–10.5)
nRBC: 0 % (ref 0.0–0.2)

## 2022-04-18 LAB — BASIC METABOLIC PANEL
Anion gap: 7 (ref 5–15)
BUN: 19 mg/dL (ref 6–20)
CO2: 23 mmol/L (ref 22–32)
Calcium: 8.9 mg/dL (ref 8.9–10.3)
Chloride: 106 mmol/L (ref 98–111)
Creatinine, Ser: 0.98 mg/dL (ref 0.44–1.00)
GFR, Estimated: >60 mL/min (ref >60)
Glucose, Bld: 110 mg/dL — ABNORMAL HIGH (ref 70–99)
Potassium: 3.5 mmol/L (ref 3.5–5.1)
Sodium: 136 mmol/L (ref 135–145)

## 2022-04-18 LAB — PREGNANCY, URINE: Preg Test, Ur: NEGATIVE

## 2022-04-18 MED ORDER — FENTANYL CITRATE PF 50 MCG/ML IJ SOSY
50.0000 ug | PREFILLED_SYRINGE | Freq: Once | INTRAMUSCULAR | Status: AC
  Administered 2022-04-18: 50 ug via INTRAVENOUS
  Filled 2022-04-18: qty 1

## 2022-04-18 MED ORDER — HYDROMORPHONE HCL 1 MG/ML IJ SOLN
1.0000 mg | Freq: Once | INTRAMUSCULAR | Status: AC
  Administered 2022-04-19: 1 mg via INTRAVENOUS
  Filled 2022-04-18: qty 1

## 2022-04-18 MED ORDER — KETOROLAC TROMETHAMINE 15 MG/ML IJ SOLN
15.0000 mg | Freq: Once | INTRAMUSCULAR | Status: AC
  Administered 2022-04-18: 15 mg via INTRAVENOUS
  Filled 2022-04-18: qty 1

## 2022-04-18 MED ORDER — SODIUM CHLORIDE 0.9 % IV BOLUS
1000.0000 mL | Freq: Once | INTRAVENOUS | Status: AC
  Administered 2022-04-18: 1000 mL via INTRAVENOUS

## 2022-04-18 MED ORDER — ONDANSETRON HCL 4 MG/2ML IJ SOLN
4.0000 mg | Freq: Once | INTRAMUSCULAR | Status: AC
  Administered 2022-04-18: 4 mg via INTRAVENOUS
  Filled 2022-04-18: qty 2

## 2022-04-18 NOTE — ED Notes (Signed)
Pt aware urine sample is needed, reports unable to go at this time.

## 2022-04-18 NOTE — ED Notes (Signed)
ED Provider at bedside. 

## 2022-04-18 NOTE — ED Provider Notes (Signed)
[facility] Provider Note   CSN: [id number] Arrival date & time: 04/18/22  1754     History  Chief Complaint  Patient presents with   Flank Pain    Amy Mathis is a 42 y.o. female.  With past medical history of IBS, lupus who presents to the emergency department with flank pain.  States that she has had 10 days of right sided flank pain and dysuria. She describes having right flank pain that is constant and throbbing and squeezing. These episodes last seconds and then dissipate. She additionally is having gross hematuria, which has been ongoing and describes having difficulty urinating. She states she has lupus and usually gets hematuria and joint pains with flairs but does not normally get flank pain. She had nausea and vomiting "a few days ago" which has stopped. She denies fevers, diarrhea, vaginal discharge. She takes Plaquenil and Prednisone and has been compliant on this.   On chart review, patient was here on 04/16/2022 with right flank pain.  At that time having hematuria.  Appears to be chronic.  She had labs performed with leukocytosis to 15.4.  Gross hematuria.  Was given pain control, fluids, nausea control.  Was discharged with pain medication, and on prednisone.    Flank Pain       Home Medications Prior to Admission medications   Medication Sig Start Date End Date Taking? Authorizing Provider  diclofenac Sodium (VOLTAREN) 1 % GEL Apply 4 g topically 4 (four) times daily. Apply to affected areas 4 times daily as needed for pain. 09/24/21   Lynden Oxford Scales, PA-C  hydroxychloroquine (PLAQUENIL) 200 MG tablet Take by mouth 2 (two) times daily.    [provider]  nitrofurantoin, macrocrystal-monohydrate, (MACROBID) 100 MG capsule Take 1 capsule (100 mg total) by mouth 2 (two) times daily. 10/29/21   Truddie Hidden, MD  oxyCODONE (OXY IR/ROXICODONE) 5 MG immediate release tablet Take 1 tablet (5 mg total) by  mouth every 6 (six) hours as needed for severe pain. 04/16/22   Carlisle Cater, PA-C  predniSONE (DELTASONE) 10 MG tablet Take 5 tablets (50 mg total) by mouth daily for 14 days. 04/11/22 04/25/22  Gowens, Alver Fisher L, PA-C      Allergies    Morphine and Penicillins    Review of Systems   Review of Systems  Genitourinary:  Positive for flank pain and hematuria. Negative for decreased urine volume, dysuria, vaginal bleeding and vaginal discharge.  All other systems reviewed and are negative.   Physical Exam Updated Vital Signs BP 118/76   Pulse 82   Temp 98.9 F (37.2 C) (Oral)   Resp 19   Ht 5' 10"$  (1.778 m)   Wt (!) 146.5 kg   SpO2 96%   BMI 46.35 kg/m  Physical Exam Vitals and nursing note reviewed.  Constitutional:      General: She is in acute distress.     Appearance: She is obese. She is ill-appearing. She is not toxic-appearing.  HENT:     Head: Normocephalic and atraumatic.  Eyes:     General: No scleral icterus.    Extraocular Movements: Extraocular movements intact.  Cardiovascular:     Rate and Rhythm: Normal rate and regular rhythm.     Pulses: Normal pulses.     Heart sounds: No murmur heard. Pulmonary:     Effort: Pulmonary effort is normal. No respiratory distress.     Breath sounds: Normal breath sounds.  Abdominal:  General: Bowel sounds are normal. There is no distension.     Palpations: Abdomen is soft.     Tenderness: There is right CVA tenderness.  Skin:    General: Skin is warm and dry.     Capillary Refill: Capillary refill takes less than 2 seconds.     Findings: No rash.  Neurological:     General: No focal deficit present.     Mental Status: She is alert and oriented to person, place, and time.  Psychiatric:        Mood and Affect: Mood normal.        Behavior: Behavior normal.        Thought Content: Thought content normal.        Judgment: Judgment normal.     ED Results / Procedures / Treatments   Labs (all labs ordered are  listed, but only abnormal results are displayed) Labs Reviewed  URINALYSIS, ROUTINE W REFLEX MICROSCOPIC - Abnormal; Notable for the following components:      Result Value   Color, Urine RED (*)    APPearance TURBID (*)    Glucose, UA   (*)    Value: TEST NOT REPORTED DUE TO COLOR INTERFERENCE OF URINE PIGMENT   Hgb urine dipstick   (*)    Value: TEST NOT REPORTED DUE TO COLOR INTERFERENCE OF URINE PIGMENT   Bilirubin Urine   (*)    Value: TEST NOT REPORTED DUE TO COLOR INTERFERENCE OF URINE PIGMENT   Ketones, ur   (*)    Value: TEST NOT REPORTED DUE TO COLOR INTERFERENCE OF URINE PIGMENT   Protein, ur   (*)    Value: TEST NOT REPORTED DUE TO COLOR INTERFERENCE OF URINE PIGMENT   Nitrite   (*)    Value: TEST NOT REPORTED DUE TO COLOR INTERFERENCE OF URINE PIGMENT   Leukocytes,Ua   (*)    Value: TEST NOT REPORTED DUE TO COLOR INTERFERENCE OF URINE PIGMENT   All other components within normal limits  CBC - Abnormal; Notable for the following components:   WBC 13.3 (*)    All other components within normal limits  BASIC METABOLIC PANEL - Abnormal; Notable for the following components:   Glucose, Bld 110 (*)    All other components within normal limits  PREGNANCY, URINE  URINALYSIS, MICROSCOPIC (REFLEX)    EKG None  Radiology No results found.  Procedures Procedures   Medications Ordered in ED Medications  sodium chloride 0.9 % bolus 1,000 mL (0 mLs Intravenous Stopped 04/19/22 0054)  ketorolac (TORADOL) 15 MG/ML injection 15 mg (15 mg Intravenous Given 04/18/22 2156)  fentaNYL (SUBLIMAZE) injection 50 mcg (50 mcg Intravenous Given 04/18/22 2157)  ondansetron (ZOFRAN) injection 4 mg (4 mg Intravenous Given 04/18/22 2156)  HYDROmorphone (DILAUDID) injection 1 mg (1 mg Intravenous Given 04/19/22 0002)  HYDROmorphone (DILAUDID) injection 1 mg (1 mg Intravenous Given 04/19/22 0037)    ED Course/ Medical Decision Making/ A&P                            Medical Decision Making Amount  and/or Complexity of Data Reviewed Labs: ordered.  Risk Prescription drug management.  Initial Impression and Ddx 42 year old female who presents with hematuria and flank pain. On my initial exam she is uncomfortable appearing, holding her right flank. Abdomen is non-peritonitic.  Patient PMH that increases complexity of ED encounter:  IBS, lupus  Differential: cystitis, lupus nephritis, renal stone, obstructed stone,  infected stone, etc.  Interpretation of Diagnostics I independent reviewed and interpreted the labs as followed: UA gross hematuria, BMP with normal renal function, not pregnant, leukocytosis improving to 13.3 from 15  - I independently visualized the following imaging with scope of interpretation limited to determining acute life threatening conditions related to emergency care: not indicated  Patient Reassessment and Ultimate Disposition/Management Patient is initially uncomfortable appearing. Given IVF, ketorolac, fentanyl and zofran initially with poor pain control. She was redosed twice with dilaudid with some improvement in symptoms.  Labs with ongoing hematuria with no evidence of UTI. No pregnant. No AKI or elevated BUN. Leukocytosis improving from previous.   She had recent CT AP on 04/11/22 for same complaint (now with ongoing symptoms) which did not demonstrate right sided calculus, renal stranding, or other abnormalities. Do not feel she needs to be rescanned at this time.   Symptoms are also inconsistent with acute hepatobiliary disease, PUD or gastritis, SBO, Zoster, stone, obstructed stone, infected stone, pyelonephritis.   I feel her symptoms are likely related to her lupus. She has rheumatology appointment scheduled. She is currently on a prednisone taper which is appropriate. Will discharge with strict return precautions.  The patient has been appropriately medically screened and/or stabilized in the ED. I have low suspicion for any other emergent medical  condition which would require further screening, evaluation or treatment in the ED or require inpatient management. At time of discharge the patient is hemodynamically stable and in no acute distress. I have discussed work-up results and diagnosis with patient and answered all questions. Patient is agreeable with discharge plan. We discussed strict return precautions for returning to the emergency department and they verbalized understanding.     Patient management required discussion with the following services or consulting groups:  None  Complexity of Problems Addressed Chronic illness with exacerbation  Additional Data Reviewed and Analyzed Further history obtained from: Past medical history and medications listed in the EMR, Prior ED visit notes, Recent Consult notes, Care Everywhere, and Prior labs/imaging results  Patient Encounter Risk Assessment SDOH impact on management, Use of parenteral controlled substances, and Consideration of hospitalization  Final Clinical Impression(s) / ED Diagnoses Final diagnoses:  Right flank pain    Rx / DC Orders ED Discharge Orders     None         Mickie Hillier, PA-C 04/24/22 1506    Fredia Sorrow, MD 04/28/22 [phone]

## 2022-04-18 NOTE — ED Triage Notes (Signed)
C/o right flank pain radiating to abdomen, states dark red hematuria. Was seen here for same on 1/30. Appears uncomfortable in triage. States prescribed meds isnt helping the pain anymore

## 2022-04-19 MED ORDER — HYDROMORPHONE HCL 1 MG/ML IJ SOLN
1.0000 mg | Freq: Once | INTRAMUSCULAR | Status: AC
  Administered 2022-04-19: 1 mg via INTRAVENOUS
  Filled 2022-04-19: qty 1

## 2022-04-19 NOTE — ED Notes (Signed)
D/c paperwork reviewed with pt, including follow up care.  No questions or concerns voiced at time of d/c. Marland Kitchen Pt verbalized understanding, Ambulatory without assistance to ED exit, NAD.  Pt reports husband unavailable for transport, she will contact an uber. Ambulatory with steady gait to exit.

## 2022-04-19 NOTE — ED Notes (Signed)
Pt reports her husband will be able to pick her up after narcotic administration.

## 2022-04-19 NOTE — Discharge Instructions (Signed)
You were seen in the emergency department today for flank pain and blood in your urine. Please follow-up with your rheumatologist for maintenance therapy of your lupus. Please continue taking your steroids as prescribed and Plaquenil. Please return for worsening symptoms like decreased urine output or fevers.

## 2022-05-11 ENCOUNTER — Emergency Department (HOSPITAL_BASED_OUTPATIENT_CLINIC_OR_DEPARTMENT_OTHER)
Admission: EM | Admit: 2022-05-11 | Discharge: 2022-05-11 | Disposition: A | Discharge status: Home / Self Care | Payer: Medicaid Other | Attending: Emergency Medicine | Admitting: Emergency Medicine

## 2022-05-11 ENCOUNTER — Other Ambulatory Visit: Payer: Self-pay

## 2022-05-11 ENCOUNTER — Encounter (HOSPITAL_BASED_OUTPATIENT_CLINIC_OR_DEPARTMENT_OTHER): Payer: Self-pay | Admitting: Emergency Medicine

## 2022-05-11 DIAGNOSIS — R109 Unspecified abdominal pain: Secondary | ICD-10-CM | POA: Diagnosis not present

## 2022-05-11 DIAGNOSIS — R1011 Right upper quadrant pain: Secondary | ICD-10-CM | POA: Diagnosis not present

## 2022-05-11 DIAGNOSIS — R112 Nausea with vomiting, unspecified: Secondary | ICD-10-CM | POA: Diagnosis not present

## 2022-05-11 DIAGNOSIS — R310 Gross hematuria: Secondary | ICD-10-CM | POA: Insufficient documentation

## 2022-05-11 LAB — BASIC METABOLIC PANEL
Anion gap: 6 (ref 5–15)
BUN: 6 mg/dL (ref 6–20)
CO2: 23 mmol/L (ref 22–32)
Calcium: 9 mg/dL (ref 8.9–10.3)
Chloride: 106 mmol/L (ref 98–111)
Creatinine, Ser: 0.83 mg/dL (ref 0.44–1.00)
GFR, Estimated: >60 mL/min (ref >60)
Glucose, Bld: 122 mg/dL — ABNORMAL HIGH (ref 70–99)
Potassium: 4.2 mmol/L (ref 3.5–5.1)
Sodium: 135 mmol/L (ref 135–145)

## 2022-05-11 LAB — CBC
HCT: 38.8 % (ref 36.0–46.0)
Hemoglobin: 13.8 g/dL (ref 12.0–15.0)
MCH: 29.1 pg (ref 26.0–34.0)
MCHC: 35.6 g/dL (ref 30.0–36.0)
MCV: 81.7 fL (ref 80.0–100.0)
Platelets: 397 10*3/uL (ref 150–400)
RBC: 4.75 MIL/uL (ref 3.87–5.11)
RDW: 14 % (ref 11.5–15.5)
WBC: 9 10*3/uL (ref 4.0–10.5)
nRBC: 0 % (ref 0.0–0.2)

## 2022-05-11 LAB — URINALYSIS, ROUTINE W REFLEX MICROSCOPIC

## 2022-05-11 LAB — URINALYSIS, MICROSCOPIC (REFLEX): RBC / HPF: >50 RBC/hpf (ref 0–5)

## 2022-05-11 MED ORDER — METHOCARBAMOL 500 MG PO TABS: 500.0000 mg | ORAL_TABLET | Freq: Two times a day (BID) | ORAL | 0 refills | Status: DC

## 2022-05-11 MED ORDER — HYDROMORPHONE HCL 1 MG/ML IJ SOLN
1.0000 mg | Freq: Once | INTRAMUSCULAR | Status: AC
  Administered 2022-05-11: 1 mg via INTRAVENOUS
  Filled 2022-05-11: qty 1

## 2022-05-11 MED ORDER — ONDANSETRON HCL 4 MG/2ML IJ SOLN
4.0000 mg | Freq: Once | INTRAMUSCULAR | Status: AC
  Administered 2022-05-11: 4 mg via INTRAVENOUS
  Filled 2022-05-11: qty 2

## 2022-05-11 MED ORDER — SODIUM CHLORIDE 0.9 % IV BOLUS
1000.0000 mL | Freq: Once | INTRAVENOUS | Status: AC
  Administered 2022-05-11: 1000 mL via INTRAVENOUS

## 2022-05-11 MED ORDER — ONDANSETRON 4 MG PO TBDP: 4.0000 mg | ORAL_TABLET | Freq: Three times a day (TID) | ORAL | 0 refills | Status: DC | PRN

## 2022-05-11 NOTE — ED Notes (Signed)
Patient is unable to give a urine specimen at this time

## 2022-05-11 NOTE — ED Triage Notes (Signed)
Pt c/o RT flank pain w/ radiation to RLQ; here for same multiple times recently

## 2022-05-11 NOTE — ED Provider Notes (Signed)
[facility] Provider Note   CSN: [id number] Arrival date & time: 05/11/22  1757     History  Chief Complaint  Patient presents with   Flank Pain    Amy Mathis is a 42 y.o. female.  42 year old female with past medical history of lupus, IBS presents with complaint of right flank pain described as spasming pain, similar to prior episodes which she reports are related to her lupus.  States she had 1 episode of vomiting followed by gross hematuria.  States that she was scheduled to see her rheumatologist at the beginning of February however they canceled the appointment and she is now rescheduled for mid March.  Denies fevers, chills, changes in bowel or bladder habits.  Prior abdominal surgeries include complete hysterectomy, cholecystectomy.       Home Medications Prior to Admission medications   Medication Sig Start Date End Date Taking? Authorizing Provider  methocarbamol (ROBAXIN) 500 MG tablet Take 1 tablet (500 mg total) by mouth 2 (two) times daily. 05/11/22  Yes Tacy Learn, PA-C  ondansetron (ZOFRAN-ODT) 4 MG disintegrating tablet Take 1 tablet (4 mg total) by mouth every 8 (eight) hours as needed for nausea or vomiting. 05/11/22  Yes Tacy Learn, PA-C  diclofenac Sodium (VOLTAREN) 1 % GEL Apply 4 g topically 4 (four) times daily. Apply to affected areas 4 times daily as needed for pain. 09/24/21   Lynden Oxford Scales, PA-C  hydroxychloroquine (PLAQUENIL) 200 MG tablet Take by mouth 2 (two) times daily.    [provider]  nitrofurantoin, macrocrystal-monohydrate, (MACROBID) 100 MG capsule Take 1 capsule (100 mg total) by mouth 2 (two) times daily. 10/29/21   Truddie Hidden, MD  oxyCODONE (OXY IR/ROXICODONE) 5 MG immediate release tablet Take 1 tablet (5 mg total) by mouth every 6 (six) hours as needed for severe pain. 04/16/22   Carlisle Cater, PA-C      Allergies    Morphine and Penicillins    Review of  Systems   Review of Systems Negative except as per HPI Physical Exam Updated Vital Signs BP 112/86   Pulse 85   Temp 98.8 F (37.1 C) (Oral)   Resp (!) 22   Ht '5\' 11"'$  (1.803 m)   Wt (!) 149.7 kg   SpO2 97%   BMI 46.03 kg/m  Physical Exam Vitals and nursing note reviewed.  Constitutional:      General: She is not in acute distress.    Appearance: She is well-developed. She is not diaphoretic.  HENT:     Head: Normocephalic and atraumatic.  Cardiovascular:     Rate and Rhythm: Normal rate and regular rhythm.     Heart sounds: Normal heart sounds.  Pulmonary:     Effort: Pulmonary effort is normal.  Abdominal:     Palpations: Abdomen is soft.     Tenderness: There is abdominal tenderness in the right upper quadrant. There is no right CVA tenderness or left CVA tenderness.  Skin:    General: Skin is warm and dry.     Findings: No erythema or rash.  Neurological:     Mental Status: She is alert and oriented to person, place, and time.  Psychiatric:        Behavior: Behavior normal.     ED Results / Procedures / Treatments   Labs (all labs ordered are listed, but only abnormal results are displayed) Labs Reviewed  URINALYSIS, ROUTINE W REFLEX MICROSCOPIC - Abnormal; Notable for  the following components:      Result Value   Color, Urine RED (*)    APPearance TURBID (*)    Glucose, UA   (*)    Value: TEST NOT REPORTED DUE TO COLOR INTERFERENCE OF URINE PIGMENT   Hgb urine dipstick   (*)    Value: TEST NOT REPORTED DUE TO COLOR INTERFERENCE OF URINE PIGMENT   Bilirubin Urine   (*)    Value: TEST NOT REPORTED DUE TO COLOR INTERFERENCE OF URINE PIGMENT   Ketones, ur   (*)    Value: TEST NOT REPORTED DUE TO COLOR INTERFERENCE OF URINE PIGMENT   Protein, ur   (*)    Value: TEST NOT REPORTED DUE TO COLOR INTERFERENCE OF URINE PIGMENT   Nitrite   (*)    Value: TEST NOT REPORTED DUE TO COLOR INTERFERENCE OF URINE PIGMENT   Leukocytes,Ua   (*)    Value: TEST NOT REPORTED  DUE TO COLOR INTERFERENCE OF URINE PIGMENT   All other components within normal limits  BASIC METABOLIC PANEL - Abnormal; Notable for the following components:   Glucose, Bld 122 (*)    All other components within normal limits  URINALYSIS, MICROSCOPIC (REFLEX) - Abnormal; Notable for the following components:   Bacteria, UA RARE (*)    All other components within normal limits  CBC    EKG None  Radiology No results found.  Procedures Procedures    Medications Ordered in ED Medications  ondansetron (ZOFRAN) injection 4 mg (has no administration in time range)  sodium chloride 0.9 % bolus 1,000 mL (1,000 mLs Intravenous New Bag/Given 05/11/22 2120)  ondansetron (ZOFRAN) injection 4 mg (4 mg Intravenous Given 05/11/22 2120)  HYDROmorphone (DILAUDID) injection 1 mg (1 mg Intravenous Given 05/11/22 2120)    ED Course/ Medical Decision Making/ A&P                             Medical Decision Making Amount and/or Complexity of Data Reviewed Labs: ordered.  Risk Prescription drug management.   42 year old female presents with complaint of right flank pain with multiple similar episodes previously without new or different symptoms.  On exam has right upper quadrant tenderness.  States that her symptoms started the other day after she vomited once and then had gross hematuria.  Vitals reviewed, reassuring. CBC within normal limits.  Urinalysis grossly bloody without complaint of dysuria.  Most recent CT scan from end of January reviewed, punctate left renal stone, no right-sided stones.  Prior cholecystectomy, no concern for acute cholecystitis.  Plan is to hydrate and provide pain medications and antiemetics with likely discharge home to follow-up with her care team.  BMP without significant findings, normal renal function.  Pain improved after Dilaudid and IV fluids.  States that she is still little bit nauseous but not vomiting, is provided with additional Zofran prior to  discharge.  Provided with prescription for Robaxin for her spasms as well as Zofran for vomiting.  Recommend recheck with her rheumatologist.  States that she is already been to nephrology and urology in the past for her hematuria, both specialties have deferred further management to rheumatology at this time.       Final Clinical Impression(s) / ED Diagnoses Final diagnoses:  Right flank pain  Gross hematuria    Rx / DC Orders ED Discharge Orders          Ordered    methocarbamol (ROBAXIN) 500 MG  tablet  2 times daily        05/11/22 2153    ondansetron (ZOFRAN-ODT) 4 MG disintegrating tablet  Every 8 hours PRN        05/11/22 2157              Tacy Learn, PA-C 05/11/22 2202    Gareth Morgan, MD 05/12/22 1304

## 2022-05-11 NOTE — Discharge Instructions (Addendum)
Follow-up with your rheumatologist. Robaxin prescribed for spasms.

## 2022-05-11 NOTE — ED Notes (Signed)
Pt sent to restroom to attempt a urine sample.

## 2022-05-22 ENCOUNTER — Other Ambulatory Visit: Payer: Self-pay

## 2022-05-22 ENCOUNTER — Encounter (HOSPITAL_BASED_OUTPATIENT_CLINIC_OR_DEPARTMENT_OTHER): Payer: Self-pay

## 2022-05-22 ENCOUNTER — Emergency Department (HOSPITAL_BASED_OUTPATIENT_CLINIC_OR_DEPARTMENT_OTHER)
Admission: EM | Admit: 2022-05-22 | Discharge: 2022-05-22 | Disposition: A | Discharge status: Home / Self Care | Payer: Medicaid Other | Attending: Emergency Medicine | Admitting: Emergency Medicine

## 2022-05-22 DIAGNOSIS — R110 Nausea: Secondary | ICD-10-CM | POA: Diagnosis not present

## 2022-05-22 DIAGNOSIS — R319 Hematuria, unspecified: Secondary | ICD-10-CM | POA: Diagnosis not present

## 2022-05-22 DIAGNOSIS — R1011 Right upper quadrant pain: Secondary | ICD-10-CM | POA: Diagnosis not present

## 2022-05-22 DIAGNOSIS — R109 Unspecified abdominal pain: Secondary | ICD-10-CM

## 2022-05-22 LAB — COMPREHENSIVE METABOLIC PANEL
ALT: 16 U/L (ref 0–44)
AST: 26 U/L (ref 15–41)
Albumin: 3.8 g/dL (ref 3.5–5.0)
Alkaline Phosphatase: 62 U/L (ref 38–126)
Anion gap: 5 (ref 5–15)
BUN: 10 mg/dL (ref 6–20)
CO2: 25 mmol/L (ref 22–32)
Calcium: 8.9 mg/dL (ref 8.9–10.3)
Chloride: 105 mmol/L (ref 98–111)
Creatinine, Ser: 0.83 mg/dL (ref 0.44–1.00)
GFR, Estimated: >60 mL/min (ref >60)
Glucose, Bld: 90 mg/dL (ref 70–99)
Potassium: 4.2 mmol/L (ref 3.5–5.1)
Sodium: 135 mmol/L (ref 135–145)
Total Bilirubin: 0.8 mg/dL (ref 0.3–1.2)
Total Protein: 8.8 g/dL — ABNORMAL HIGH (ref 6.5–8.1)

## 2022-05-22 LAB — URINALYSIS, ROUTINE W REFLEX MICROSCOPIC

## 2022-05-22 LAB — CBC WITH DIFFERENTIAL/PLATELET
Abs Immature Granulocytes: 0.02 10*3/uL (ref 0.00–0.07)
Basophils Absolute: 0.1 10*3/uL (ref 0.0–0.1)
Basophils Relative: 1 %
Eosinophils Absolute: 0.1 10*3/uL (ref 0.0–0.5)
Eosinophils Relative: 2 %
HCT: 40.3 % (ref 36.0–46.0)
Hemoglobin: 14.2 g/dL (ref 12.0–15.0)
Immature Granulocytes: 0 %
Lymphocytes Relative: 40 %
Lymphs Abs: 3.7 10*3/uL (ref 0.7–4.0)
MCH: 28.9 pg (ref 26.0–34.0)
MCHC: 35.2 g/dL (ref 30.0–36.0)
MCV: 81.9 fL (ref 80.0–100.0)
Monocytes Absolute: 0.6 10*3/uL (ref 0.1–1.0)
Monocytes Relative: 6 %
Neutro Abs: 4.7 10*3/uL (ref 1.7–7.7)
Neutrophils Relative %: 51 %
Platelets: 367 10*3/uL (ref 150–400)
RBC: 4.92 MIL/uL (ref 3.87–5.11)
RDW: 14.1 % (ref 11.5–15.5)
WBC: 9.2 10*3/uL (ref 4.0–10.5)
nRBC: 0 % (ref 0.0–0.2)

## 2022-05-22 LAB — LIPASE, BLOOD: Lipase: 33 U/L (ref 11–51)

## 2022-05-22 LAB — URINALYSIS, MICROSCOPIC (REFLEX): RBC / HPF: >50 RBC/hpf (ref 0–5)

## 2022-05-22 MED ORDER — DROPERIDOL 2.5 MG/ML IJ SOLN
1.2500 mg | Freq: Once | INTRAMUSCULAR | Status: AC
  Administered 2022-05-22: 1.25 mg via INTRAVENOUS
  Filled 2022-05-22: qty 2

## 2022-05-22 MED ORDER — SODIUM CHLORIDE 0.9 % IV BOLUS
1000.0000 mL | Freq: Once | INTRAVENOUS | Status: AC
  Administered 2022-05-22: 1000 mL via INTRAVENOUS

## 2022-05-22 MED ORDER — KETOROLAC TROMETHAMINE 15 MG/ML IJ SOLN
15.0000 mg | Freq: Once | INTRAMUSCULAR | Status: AC
  Administered 2022-05-22: 15 mg via INTRAVENOUS
  Filled 2022-05-22: qty 1

## 2022-05-22 MED ORDER — HYDROMORPHONE HCL 1 MG/ML IJ SOLN
1.0000 mg | Freq: Once | INTRAMUSCULAR | Status: AC
  Administered 2022-05-22: 1 mg via INTRAVENOUS
  Filled 2022-05-22: qty 1

## 2022-05-22 MED ORDER — DIPHENHYDRAMINE HCL 50 MG/ML IJ SOLN
25.0000 mg | Freq: Once | INTRAMUSCULAR | Status: AC
  Administered 2022-05-22: 25 mg via INTRAVENOUS
  Filled 2022-05-22: qty 1

## 2022-05-22 NOTE — ED Provider Notes (Signed)
[facility] Provider Note   CSN: [id number] Arrival date & time: 05/22/22  1521     History  Chief Complaint  Patient presents with   Abdominal Pain   Flank Pain    Amy Mathis is a 42 y.o. female.  42 yo F with a chief complaint of right flank pain.  This has been an ongoing problem for her.  She tells me that is how she presents with her lupus.  She unfortunately has recently moved to the area and has not yet been able to see a rheumatologist.  She has an appointment in about 9 days.  She has had multiple trips to the ED for recurrent pain.  She thinks this feels exactly the same.  Associated with hematuria.  Pain seems to be colicky is mostly in the right upper quadrant.  She had had similar pain and had her gallbladder removed but no resolution.  It was eventually thought to be most likely due to her lupus.  She denies fevers.  Has had some nausea.  Denies urinary symptoms.   Abdominal Pain Flank Pain Associated symptoms include abdominal pain.       Home Medications Prior to Admission medications   Medication Sig Start Date End Date Taking? Authorizing Provider  diclofenac Sodium (VOLTAREN) 1 % GEL Apply 4 g topically 4 (four) times daily. Apply to affected areas 4 times daily as needed for pain. 09/24/21   Lynden Oxford Scales, PA-C  hydroxychloroquine (PLAQUENIL) 200 MG tablet Take by mouth 2 (two) times daily.    [provider]  methocarbamol (ROBAXIN) 500 MG tablet Take 1 tablet (500 mg total) by mouth 2 (two) times daily. 05/11/22   Tacy Learn, PA-C  nitrofurantoin, macrocrystal-monohydrate, (MACROBID) 100 MG capsule Take 1 capsule (100 mg total) by mouth 2 (two) times daily. 10/29/21   Truddie Hidden, MD  ondansetron (ZOFRAN-ODT) 4 MG disintegrating tablet Take 1 tablet (4 mg total) by mouth every 8 (eight) hours as needed for nausea or vomiting. 05/11/22   Suella Broad A, PA-C  oxyCODONE (OXY  IR/ROXICODONE) 5 MG immediate release tablet Take 1 tablet (5 mg total) by mouth every 6 (six) hours as needed for severe pain. 04/16/22   Carlisle Cater, PA-C      Allergies    Morphine and Penicillins    Review of Systems   Review of Systems  Gastrointestinal:  Positive for abdominal pain.  Genitourinary:  Positive for flank pain.    Physical Exam Updated Vital Signs BP 113/78 (BP Location: Right Arm)   Pulse (!) 57   Temp 98.5 F (36.9 C) (Oral)   Resp 18   Ht '5\' 11"'$  (1.803 m)   Wt (!) 146.5 kg   SpO2 98%   BMI 45.05 kg/m  Physical Exam Vitals and nursing note reviewed.  Constitutional:      General: She is not in acute distress.    Appearance: She is well-developed. She is not diaphoretic.     Comments: BMI 45  HENT:     Head: Normocephalic and atraumatic.  Eyes:     Pupils: Pupils are equal, round, and reactive to light.  Cardiovascular:     Rate and Rhythm: Normal rate and regular rhythm.     Heart sounds: No murmur heard.    No friction rub. No gallop.  Pulmonary:     Effort: Pulmonary effort is normal.     Breath sounds: No wheezing or rales.  Abdominal:  General: There is no distension.     Palpations: Abdomen is soft.     Tenderness: There is no abdominal tenderness.  Musculoskeletal:        General: No tenderness.     Cervical back: Normal range of motion and neck supple.  Skin:    General: Skin is warm and dry.  Neurological:     Mental Status: She is alert and oriented to person, place, and time.  Psychiatric:        Behavior: Behavior normal.     ED Results / Procedures / Treatments   Labs (all labs ordered are listed, but only abnormal results are displayed) Labs Reviewed  COMPREHENSIVE METABOLIC PANEL - Abnormal; Notable for the following components:      Result Value   Total Protein 8.8 (*)    All other components within normal limits  URINALYSIS, ROUTINE W REFLEX MICROSCOPIC - Abnormal; Notable for the following components:    Color, Urine RED (*)    APPearance CLOUDY (*)    Glucose, UA   (*)    Value: TEST NOT REPORTED DUE TO COLOR INTERFERENCE OF URINE PIGMENT   Hgb urine dipstick   (*)    Value: TEST NOT REPORTED DUE TO COLOR INTERFERENCE OF URINE PIGMENT   Bilirubin Urine   (*)    Value: TEST NOT REPORTED DUE TO COLOR INTERFERENCE OF URINE PIGMENT   Ketones, ur   (*)    Value: TEST NOT REPORTED DUE TO COLOR INTERFERENCE OF URINE PIGMENT   Protein, ur   (*)    Value: TEST NOT REPORTED DUE TO COLOR INTERFERENCE OF URINE PIGMENT   Nitrite   (*)    Value: TEST NOT REPORTED DUE TO COLOR INTERFERENCE OF URINE PIGMENT   Leukocytes,Ua   (*)    Value: TEST NOT REPORTED DUE TO COLOR INTERFERENCE OF URINE PIGMENT   All other components within normal limits  URINALYSIS, MICROSCOPIC (REFLEX) - Abnormal; Notable for the following components:   Bacteria, UA MANY (*)    All other components within normal limits  CBC WITH DIFFERENTIAL/PLATELET  LIPASE, BLOOD    EKG None  Radiology No results found.  Procedures Procedures    Medications Ordered in ED Medications  HYDROmorphone (DILAUDID) injection 1 mg (1 mg Intravenous Given 05/22/22 1557)  droperidol (INAPSINE) 2.5 MG/ML injection 1.25 mg (1.25 mg Intravenous Given 05/22/22 1606)  diphenhydrAMINE (BENADRYL) injection 25 mg (25 mg Intravenous Given 05/22/22 1558)  sodium chloride 0.9 % bolus 1,000 mL (1,000 mLs Intravenous New Bag/Given 05/22/22 1557)  ketorolac (TORADOL) 15 MG/ML injection 15 mg (15 mg Intravenous Given 05/22/22 1652)  HYDROmorphone (DILAUDID) injection 1 mg (1 mg Intravenous Given 05/22/22 1701)    ED Course/ Medical Decision Making/ A&P                             Medical Decision Making Amount and/or Complexity of Data Reviewed Labs: ordered.  Risk Prescription drug management.   42 yo F with a chief complaint of right flank pain.  Is actually the patient's eighth visit for this in the past 6 months to this hospital system.  Has had 3 CT  scans done on my record review.  Her blood work is unremarkable here, no leukocytosis, no significant electrolyte abnormality, LFTs and lipase are unremarkable.  Her urine is grossly bloody and difficult to interpret which is consistent with every other urine sample that she had in our system.  She is feeling a bit better on repeat assessment.  Will have her try to go home and follow-up with her rheumatologist.  5:02 PM:  I have discussed the diagnosis/risks/treatment options with the patient.  Evaluation and diagnostic testing in the emergency department does not suggest an emergent condition requiring admission or immediate intervention beyond what has been performed at this time.  They will follow up with PCP, rheum. We also discussed returning to the ED immediately if new or worsening sx occur. We discussed the sx which are most concerning (e.g., sudden worsening pain, fever, inability to tolerate by mouth) that necessitate immediate return. Medications administered to the patient during their visit and any new prescriptions provided to the patient are listed below.  Medications given during this visit Medications  HYDROmorphone (DILAUDID) injection 1 mg (1 mg Intravenous Given 05/22/22 1557)  droperidol (INAPSINE) 2.5 MG/ML injection 1.25 mg (1.25 mg Intravenous Given 05/22/22 1606)  diphenhydrAMINE (BENADRYL) injection 25 mg (25 mg Intravenous Given 05/22/22 1558)  sodium chloride 0.9 % bolus 1,000 mL (1,000 mLs Intravenous New Bag/Given 05/22/22 1557)  ketorolac (TORADOL) 15 MG/ML injection 15 mg (15 mg Intravenous Given 05/22/22 1652)  HYDROmorphone (DILAUDID) injection 1 mg (1 mg Intravenous Given 05/22/22 1701)     The patient appears reasonably screen and/or stabilized for discharge and I doubt any other medical condition or other [facility] requiring further screening, evaluation, or treatment in the ED at this time prior to discharge.          Final Clinical Impression(s) / ED Diagnoses Final  diagnoses:  Right flank pain    Rx / DC Orders ED Discharge Orders     None         Deno Etienne, DO 05/22/22 1702

## 2022-05-22 NOTE — Discharge Instructions (Signed)
Take 4 over the counter ibuprofen tablets 3 times a day or 2 over-the-counter naproxen tablets twice a day for pain. Also take tylenol 1000mg(2 extra strength) four times a day.    

## 2022-05-22 NOTE — ED Triage Notes (Signed)
C/o right flank pain radiating to abdomen, here for same multiple times recently. States hx of lupus and has flares that are the same way, started yesterday.

## 2022-05-23 ENCOUNTER — Other Ambulatory Visit: Payer: Self-pay

## 2022-05-23 ENCOUNTER — Emergency Department (HOSPITAL_BASED_OUTPATIENT_CLINIC_OR_DEPARTMENT_OTHER)
Admission: EM | Admit: 2022-05-23 | Discharge: 2022-05-23 | Disposition: A | Discharge status: Home / Self Care | Payer: Medicaid Other | Attending: Emergency Medicine | Admitting: Emergency Medicine

## 2022-05-23 DIAGNOSIS — M329 Systemic lupus erythematosus, unspecified: Secondary | ICD-10-CM | POA: Diagnosis not present

## 2022-05-23 DIAGNOSIS — R109 Unspecified abdominal pain: Secondary | ICD-10-CM | POA: Diagnosis present

## 2022-05-23 DIAGNOSIS — R319 Hematuria, unspecified: Secondary | ICD-10-CM | POA: Insufficient documentation

## 2022-05-23 DIAGNOSIS — R310 Gross hematuria: Secondary | ICD-10-CM | POA: Diagnosis not present

## 2022-05-23 LAB — URINALYSIS, ROUTINE W REFLEX MICROSCOPIC

## 2022-05-23 LAB — URINALYSIS, MICROSCOPIC (REFLEX): RBC / HPF: >50 RBC/hpf (ref 0–5)

## 2022-05-23 LAB — CBC WITH DIFFERENTIAL/PLATELET
Abs Immature Granulocytes: 0.02 10*3/uL (ref 0.00–0.07)
Basophils Absolute: 0 10*3/uL (ref 0.0–0.1)
Basophils Relative: 0 %
Eosinophils Absolute: 0.1 10*3/uL (ref 0.0–0.5)
Eosinophils Relative: 1 %
HCT: 36.5 % (ref 36.0–46.0)
Hemoglobin: 13.1 g/dL (ref 12.0–15.0)
Immature Granulocytes: 0 %
Lymphocytes Relative: 38 %
Lymphs Abs: 3.4 10*3/uL (ref 0.7–4.0)
MCH: 29.2 pg (ref 26.0–34.0)
MCHC: 35.9 g/dL (ref 30.0–36.0)
MCV: 81.3 fL (ref 80.0–100.0)
Monocytes Absolute: 0.6 10*3/uL (ref 0.1–1.0)
Monocytes Relative: 6 %
Neutro Abs: 4.9 10*3/uL (ref 1.7–7.7)
Neutrophils Relative %: 55 %
Platelets: 363 10*3/uL (ref 150–400)
RBC: 4.49 MIL/uL (ref 3.87–5.11)
RDW: 14.1 % (ref 11.5–15.5)
WBC: 9 10*3/uL (ref 4.0–10.5)
nRBC: 0 % (ref 0.0–0.2)

## 2022-05-23 LAB — BASIC METABOLIC PANEL
Anion gap: 5 (ref 5–15)
BUN: 10 mg/dL (ref 6–20)
CO2: 25 mmol/L (ref 22–32)
Calcium: 8.8 mg/dL — ABNORMAL LOW (ref 8.9–10.3)
Chloride: 106 mmol/L (ref 98–111)
Creatinine, Ser: 0.88 mg/dL (ref 0.44–1.00)
GFR, Estimated: >60 mL/min (ref >60)
Glucose, Bld: 102 mg/dL — ABNORMAL HIGH (ref 70–99)
Potassium: 4.4 mmol/L (ref 3.5–5.1)
Sodium: 136 mmol/L (ref 135–145)

## 2022-05-23 MED ORDER — HYDROMORPHONE HCL 1 MG/ML IJ SOLN
1.0000 mg | Freq: Once | INTRAMUSCULAR | Status: AC
  Administered 2022-05-23: 1 mg via INTRAVENOUS
  Filled 2022-05-23: qty 1

## 2022-05-23 MED ORDER — ONDANSETRON HCL 4 MG/2ML IJ SOLN
4.0000 mg | Freq: Once | INTRAMUSCULAR | Status: AC
  Administered 2022-05-23: 4 mg via INTRAVENOUS
  Filled 2022-05-23: qty 2

## 2022-05-23 MED ORDER — SODIUM CHLORIDE 0.9 % IV BOLUS
1000.0000 mL | Freq: Once | INTRAVENOUS | Status: AC
  Administered 2022-05-23: 1000 mL via INTRAVENOUS

## 2022-05-23 MED ORDER — KETOROLAC TROMETHAMINE 30 MG/ML IJ SOLN
30.0000 mg | Freq: Once | INTRAMUSCULAR | Status: AC
  Administered 2022-05-23: 30 mg via INTRAVENOUS
  Filled 2022-05-23: qty 1

## 2022-05-23 MED ORDER — OXYCODONE HCL 5 MG PO TABS: 5.0000 mg | ORAL_TABLET | Freq: Four times a day (QID) | ORAL | 0 refills | Status: DC | PRN

## 2022-05-23 NOTE — ED Triage Notes (Signed)
Pt arrives with c/o right sided flank pain that started 2 days ago. Per pt, she believes that her lupus is flaring up. Pt endorses hematuria.

## 2022-05-23 NOTE — ED Provider Notes (Signed)
[facility] AT [facility] Provider Note   CSN: [id number] Arrival date & time: 05/23/22  2102     History  Chief Complaint  Patient presents with   Flank Pain    Ellaria Hallock is a 42 y.o. female history of lupus, here presenting with flank pain, nausea and vomiting.  Patient moved here from [place].  Patient states that she is in the process of getting into see a urologist and rheumatologist.  She states that she has chronic hematuria.  Patient has been in the ED several times for the same problem.  She was most recently here yesterday.  She had multiple CT and imaging did not show any particular masses or any kidney stone.  The history is provided by the patient.       Home Medications Prior to Admission medications   Medication Sig Start Date End Date Taking? Authorizing Provider  diclofenac Sodium (VOLTAREN) 1 % GEL Apply 4 g topically 4 (four) times daily. Apply to affected areas 4 times daily as needed for pain. 09/24/21   Lynden Oxford Scales, PA-C  hydroxychloroquine (PLAQUENIL) 200 MG tablet Take by mouth 2 (two) times daily.    [provider]  methocarbamol (ROBAXIN) 500 MG tablet Take 1 tablet (500 mg total) by mouth 2 (two) times daily. 05/11/22   Tacy Learn, PA-C  nitrofurantoin, macrocrystal-monohydrate, (MACROBID) 100 MG capsule Take 1 capsule (100 mg total) by mouth 2 (two) times daily. 10/29/21   Truddie Hidden, MD  ondansetron (ZOFRAN-ODT) 4 MG disintegrating tablet Take 1 tablet (4 mg total) by mouth every 8 (eight) hours as needed for nausea or vomiting. 05/11/22   Suella Broad A, PA-C  oxyCODONE (OXY IR/ROXICODONE) 5 MG immediate release tablet Take 1 tablet (5 mg total) by mouth every 6 (six) hours as needed for severe pain. 04/16/22   Carlisle Cater, PA-C      Allergies    Morphine and Penicillins    Review of Systems   Review of Systems  Genitourinary:  Positive for flank pain.  All other systems  reviewed and are negative.   Physical Exam Updated Vital Signs BP (!) 161/80 (BP Location: Left Arm)   Pulse 97   Temp 98.7 F (37.1 C) (Oral)   Resp 20   Ht '5\' 11"'$  (1.803 m)   Wt (!) 146.5 kg   SpO2 100%   BMI 45.05 kg/m  Physical Exam Vitals and nursing note reviewed.  Constitutional:      Appearance: Normal appearance.     Comments: Uncomfortable  HENT:     Head: Normocephalic.     Nose: Nose normal.     Mouth/Throat:     Mouth: Mucous membranes are moist.  Eyes:     Extraocular Movements: Extraocular movements intact.     Pupils: Pupils are equal, round, and reactive to light.  Cardiovascular:     Rate and Rhythm: Normal rate and regular rhythm.     Pulses: Normal pulses.     Heart sounds: Normal heart sounds.  Pulmonary:     Effort: Pulmonary effort is normal.     Breath sounds: Normal breath sounds.  Abdominal:     General: Abdomen is flat.     Comments: Right CVA tenderness  Musculoskeletal:        General: Normal range of motion.     Cervical back: Normal range of motion and neck supple.  Skin:    General: Skin is warm.  Capillary Refill: Capillary refill takes less than 2 seconds.  Neurological:     General: No focal deficit present.     Mental Status: She is alert and oriented to person, place, and time.  Psychiatric:        Mood and Affect: Mood normal.        Behavior: Behavior normal.     ED Results / Procedures / Treatments   Labs (all labs ordered are listed, but only abnormal results are displayed) Labs Reviewed  BASIC METABOLIC PANEL - Abnormal; Notable for the following components:      Result Value   Glucose, Bld 102 (*)    Calcium 8.8 (*)    All other components within normal limits  URINALYSIS, ROUTINE W REFLEX MICROSCOPIC - Abnormal; Notable for the following components:   Color, Urine RED (*)    APPearance TURBID (*)    Glucose, UA   (*)    Value: TEST NOT REPORTED DUE TO COLOR INTERFERENCE OF URINE PIGMENT   Hgb urine  dipstick   (*)    Value: TEST NOT REPORTED DUE TO COLOR INTERFERENCE OF URINE PIGMENT   Bilirubin Urine   (*)    Value: TEST NOT REPORTED DUE TO COLOR INTERFERENCE OF URINE PIGMENT   Ketones, ur   (*)    Value: TEST NOT REPORTED DUE TO COLOR INTERFERENCE OF URINE PIGMENT   Protein, ur   (*)    Value: TEST NOT REPORTED DUE TO COLOR INTERFERENCE OF URINE PIGMENT   Nitrite   (*)    Value: TEST NOT REPORTED DUE TO COLOR INTERFERENCE OF URINE PIGMENT   Leukocytes,Ua   (*)    Value: TEST NOT REPORTED DUE TO COLOR INTERFERENCE OF URINE PIGMENT   All other components within normal limits  URINALYSIS, MICROSCOPIC (REFLEX) - Abnormal; Notable for the following components:   Bacteria, UA FEW (*)    All other components within normal limits  CBC WITH DIFFERENTIAL/PLATELET    EKG None  Radiology No results found.  Procedures Procedures    Medications Ordered in ED Medications  sodium chloride 0.9 % bolus 1,000 mL (1,000 mLs Intravenous New Bag/Given 05/23/22 2206)  ketorolac (TORADOL) 30 MG/ML injection 30 mg (30 mg Intravenous Given 05/23/22 2207)  HYDROmorphone (DILAUDID) injection 1 mg (1 mg Intravenous Given 05/23/22 2208)  ondansetron (ZOFRAN) injection 4 mg (4 mg Intravenous Given 05/23/22 2206)    ED Course/ Medical Decision Making/ A&P                             Medical Decision Making Athlene Ringquist is a 42 y.o. female here presenting with right flank pain and persistent hematuria.  Reviewed patient's labs and imaging studies.  Patient had multiple CTs recently.  Patient also has unremarkable labs as of yesterday.  Will repeat labs and urinalysis.  Will give pain medicine and Zofran and reassess.  10:56 PM Labs stable from yesterday.  UA showed greater than 50 RBCs which is unchanged from yesterday.  No signs of UTI.  Patient has urology follow-up.  Patient is feeling better after pain medicine and nausea medicine.  Problems Addressed: Hematuria, unspecified type: acute illness or  injury Lupus ([place]): acute illness or injury  Amount and/or Complexity of Data Reviewed Labs: ordered. Decision-making details documented in ED Course.  Risk Prescription drug management.    Final Clinical Impression(s) / ED Diagnoses Final diagnoses:  None    Rx / DC Orders  ED Discharge Orders     None         Drenda Freeze, MD 05/23/22 2257

## 2022-05-23 NOTE — Discharge Instructions (Signed)
Please continue taking your current meds.  Please see urology and rheumatology for follow-up  Return to ER if you have worse flank pain or hematuria or abdominal pain or vomiting

## 2022-05-23 NOTE — ED Notes (Signed)
Gross hematuria noted in urine, pt seen here yesterday.  Pt states she is having a "lupus flare up"  rt. Sided flank x 2 days with dysuria

## 2022-05-24 MED ORDER — OXYCODONE HCL 5 MG PO TABS: 5.0000 mg | ORAL_TABLET | Freq: Four times a day (QID) | ORAL | 0 refills | Status: DC | PRN

## 2022-05-24 NOTE — ED Provider Notes (Signed)
Nursing staff received a call from patient that her prescribed pharmacy was out of her narcotic pain medication that was prescribed by previous provider who is unfortunately no longer here this evening. Chart reviewed, PDMP reviewed, medication changed to her preferred pharmacy.   Nestor Lewandowsky 05/24/22 [facility]    Veryl Speak, MD 05/24/22 [phone]

## 2022-06-15 ENCOUNTER — Other Ambulatory Visit: Payer: Self-pay

## 2022-06-15 ENCOUNTER — Emergency Department (HOSPITAL_BASED_OUTPATIENT_CLINIC_OR_DEPARTMENT_OTHER)
Admission: EM | Admit: 2022-06-15 | Discharge: 2022-06-15 | Disposition: A | Discharge status: Home / Self Care | Payer: Medicaid Other | Attending: Emergency Medicine | Admitting: Emergency Medicine

## 2022-06-15 ENCOUNTER — Emergency Department (HOSPITAL_BASED_OUTPATIENT_CLINIC_OR_DEPARTMENT_OTHER): Payer: Medicaid Other

## 2022-06-15 ENCOUNTER — Encounter (HOSPITAL_BASED_OUTPATIENT_CLINIC_OR_DEPARTMENT_OTHER): Payer: Self-pay | Admitting: Emergency Medicine

## 2022-06-15 DIAGNOSIS — R1031 Right lower quadrant pain: Secondary | ICD-10-CM | POA: Insufficient documentation

## 2022-06-15 DIAGNOSIS — R109 Unspecified abdominal pain: Secondary | ICD-10-CM | POA: Diagnosis not present

## 2022-06-15 DIAGNOSIS — R319 Hematuria, unspecified: Secondary | ICD-10-CM | POA: Insufficient documentation

## 2022-06-15 LAB — COMPREHENSIVE METABOLIC PANEL
ALT: 19 U/L (ref 0–44)
AST: 27 U/L (ref 15–41)
Albumin: 3.5 g/dL (ref 3.5–5.0)
Alkaline Phosphatase: 60 U/L (ref 38–126)
Anion gap: 7 (ref 5–15)
BUN: 9 mg/dL (ref 6–20)
CO2: 27 mmol/L (ref 22–32)
Calcium: 9.1 mg/dL (ref 8.9–10.3)
Chloride: 103 mmol/L (ref 98–111)
Creatinine, Ser: 0.7 mg/dL (ref 0.44–1.00)
GFR, Estimated: >60 mL/min (ref >60)
Glucose, Bld: 88 mg/dL (ref 70–99)
Potassium: 4.2 mmol/L (ref 3.5–5.1)
Sodium: 137 mmol/L (ref 135–145)
Total Bilirubin: 0.2 mg/dL — ABNORMAL LOW (ref 0.3–1.2)
Total Protein: 8.2 g/dL — ABNORMAL HIGH (ref 6.5–8.1)

## 2022-06-15 LAB — URINALYSIS, ROUTINE W REFLEX MICROSCOPIC

## 2022-06-15 LAB — CBC WITH DIFFERENTIAL/PLATELET
Abs Immature Granulocytes: 0.03 10*3/uL (ref 0.00–0.07)
Basophils Absolute: 0 10*3/uL (ref 0.0–0.1)
Basophils Relative: 1 %
Eosinophils Absolute: 0.2 10*3/uL (ref 0.0–0.5)
Eosinophils Relative: 3 %
HCT: 37.3 % (ref 36.0–46.0)
Hemoglobin: 13.3 g/dL (ref 12.0–15.0)
Immature Granulocytes: 0 %
Lymphocytes Relative: 36 %
Lymphs Abs: 3 10*3/uL (ref 0.7–4.0)
MCH: 28.7 pg (ref 26.0–34.0)
MCHC: 35.7 g/dL (ref 30.0–36.0)
MCV: 80.6 fL (ref 80.0–100.0)
Monocytes Absolute: 0.6 10*3/uL (ref 0.1–1.0)
Monocytes Relative: 7 %
Neutro Abs: 4.5 10*3/uL (ref 1.7–7.7)
Neutrophils Relative %: 53 %
Platelets: 376 10*3/uL (ref 150–400)
RBC: 4.63 MIL/uL (ref 3.87–5.11)
RDW: 14.2 % (ref 11.5–15.5)
WBC: 8.4 10*3/uL (ref 4.0–10.5)
nRBC: 0 % (ref 0.0–0.2)

## 2022-06-15 LAB — URINALYSIS, MICROSCOPIC (REFLEX): RBC / HPF: >50 RBC/hpf (ref 0–5)

## 2022-06-15 LAB — LIPASE, BLOOD: Lipase: 32 U/L (ref 11–51)

## 2022-06-15 MED ORDER — OXYCODONE-ACETAMINOPHEN 5-325 MG PO TABS
1.0000 | ORAL_TABLET | Freq: Once | ORAL | Status: AC
  Administered 2022-06-15: 1 via ORAL
  Filled 2022-06-15: qty 1

## 2022-06-15 MED ORDER — PREDNISONE 20 MG PO TABS: 50.0000 mg | ORAL_TABLET | Freq: Every day | ORAL | 0 refills | Status: AC

## 2022-06-15 MED ORDER — ONDANSETRON 4 MG PO TBDP
8.0000 mg | ORAL_TABLET | Freq: Once | ORAL | Status: AC
  Administered 2022-06-15: 8 mg via ORAL
  Filled 2022-06-15: qty 2

## 2022-06-15 MED ORDER — CYCLOBENZAPRINE HCL 10 MG PO TABS: 10.0000 mg | ORAL_TABLET | Freq: Two times a day (BID) | ORAL | 0 refills | Status: DC | PRN

## 2022-06-15 MED ORDER — SULFAMETHOXAZOLE-TRIMETHOPRIM 800-160 MG PO TABS: 1.0000 | ORAL_TABLET | Freq: Two times a day (BID) | ORAL | 0 refills | Status: AC

## 2022-06-15 MED ORDER — HYDROMORPHONE HCL 1 MG/ML IJ SOLN
1.0000 mg | Freq: Once | INTRAMUSCULAR | Status: AC
  Administered 2022-06-15: 1 mg via INTRAVENOUS
  Filled 2022-06-15: qty 1

## 2022-06-15 NOTE — ED Triage Notes (Signed)
Patient c/o right flank pain onset yesterday. Pt seen here for same in beginning of month.

## 2022-06-15 NOTE — ED Provider Notes (Signed)
[facility] Provider Note   CSN: [id number] Arrival date & time: 06/15/22  1450     History  Chief Complaint  Patient presents with   Flank Pain    Amy Mathis is a 42 y.o. female.   Flank Pain   42 year old female presents emergency department complaints of right-sided flank pain.  Patient states that symptoms began yesterday when she was peeling potatoes sitting down.  Denies radiation of pain.  States it feels similar to prior episodes.  Patient has been seen 9 times for similar complaint over the past 3 to 4 months.  States she noticed some blood in her urine prior to onset of pain.  Denies fever, chills, night sweats, chest pain, shortness of breath, vomiting, dysuria, urinary frequency/urgency, concerns with bowel habits.  Patient states she was sent home with oxycodone and took her last pill last night.  Past medical history significant for IBS, lupus  Home Medications Prior to Admission medications   Medication Sig Start Date End Date Taking? Authorizing Provider  sulfamethoxazole-trimethoprim (BACTRIM DS) 800-160 MG tablet Take 1 tablet by mouth 2 (two) times daily for 7 days. 06/15/22 06/22/22 Yes Dion Saucier A, PA  diclofenac Sodium (VOLTAREN) 1 % GEL Apply 4 g topically 4 (four) times daily. Apply to affected areas 4 times daily as needed for pain. 09/24/21   Lynden Oxford Scales, PA-C  hydroxychloroquine (PLAQUENIL) 200 MG tablet Take by mouth 2 (two) times daily.    [provider]  methocarbamol (ROBAXIN) 500 MG tablet Take 1 tablet (500 mg total) by mouth 2 (two) times daily. 05/11/22   Tacy Learn, PA-C  nitrofurantoin, macrocrystal-monohydrate, (MACROBID) 100 MG capsule Take 1 capsule (100 mg total) by mouth 2 (two) times daily. 10/29/21   Truddie Hidden, MD  ondansetron (ZOFRAN-ODT) 4 MG disintegrating tablet Take 1 tablet (4 mg total) by mouth every 8 (eight) hours as needed for nausea or vomiting.  05/11/22   Suella Broad A, PA-C  oxyCODONE (OXY IR/ROXICODONE) 5 MG immediate release tablet Take 1 tablet (5 mg total) by mouth every 6 (six) hours as needed for severe pain. 05/24/22   Smoot, Leary Roca, PA-C      Allergies    Morphine and Penicillins    Review of Systems   Review of Systems  Genitourinary:  Positive for flank pain.  All other systems reviewed and are negative.   Physical Exam Updated Vital Signs BP (!) 134/94   Pulse 82   Temp 98.8 F (37.1 C) (Oral)   Resp 16   Ht 5\' 11"  (1.803 m)   Wt (!) 147.9 kg   SpO2 100%   BMI 45.47 kg/m  Physical Exam Vitals and nursing note reviewed.  Constitutional:      General: She is not in acute distress.    Appearance: She is well-developed.  HENT:     Head: Normocephalic and atraumatic.  Eyes:     Conjunctiva/sclera: Conjunctivae normal.  Cardiovascular:     Rate and Rhythm: Normal rate and regular rhythm.     Heart sounds: No murmur heard. Pulmonary:     Effort: Pulmonary effort is normal. No respiratory distress.     Breath sounds: Normal breath sounds.  Abdominal:     Palpations: Abdomen is soft.     Tenderness: There is no abdominal tenderness. There is right CVA tenderness. There is no left CVA tenderness or guarding.     Hernia: No hernia is present.  Musculoskeletal:  General: No swelling.     Cervical back: Neck supple.     Right lower leg: No edema.     Left lower leg: No edema.  Skin:    General: Skin is warm and dry.     Capillary Refill: Capillary refill takes less than 2 seconds.  Neurological:     Mental Status: She is alert.  Psychiatric:        Mood and Affect: Mood normal.     ED Results / Procedures / Treatments   Labs (all labs ordered are listed, but only abnormal results are displayed) Labs Reviewed  COMPREHENSIVE METABOLIC PANEL - Abnormal; Notable for the following components:      Result Value   Total Protein 8.2 (*)    Total Bilirubin 0.2 (*)    All other components within  normal limits  URINALYSIS, ROUTINE W REFLEX MICROSCOPIC - Abnormal; Notable for the following components:   Color, Urine RED (*)    APPearance TURBID (*)    Glucose, UA   (*)    Value: TEST NOT REPORTED DUE TO COLOR INTERFERENCE OF URINE PIGMENT   Hgb urine dipstick   (*)    Value: TEST NOT REPORTED DUE TO COLOR INTERFERENCE OF URINE PIGMENT   Bilirubin Urine   (*)    Value: TEST NOT REPORTED DUE TO COLOR INTERFERENCE OF URINE PIGMENT   Ketones, ur   (*)    Value: TEST NOT REPORTED DUE TO COLOR INTERFERENCE OF URINE PIGMENT   Protein, ur   (*)    Value: TEST NOT REPORTED DUE TO COLOR INTERFERENCE OF URINE PIGMENT   Nitrite   (*)    Value: TEST NOT REPORTED DUE TO COLOR INTERFERENCE OF URINE PIGMENT   Leukocytes,Ua   (*)    Value: TEST NOT REPORTED DUE TO COLOR INTERFERENCE OF URINE PIGMENT   All other components within normal limits  URINALYSIS, MICROSCOPIC (REFLEX) - Abnormal; Notable for the following components:   Bacteria, UA MANY (*)    All other components within normal limits  URINE CULTURE  CBC WITH DIFFERENTIAL/PLATELET  LIPASE, BLOOD    EKG None  Radiology CT Renal Stone Study  Result Date: 06/15/2022 CLINICAL DATA:  Right flank pain EXAM: CT ABDOMEN AND PELVIS WITHOUT CONTRAST TECHNIQUE: Multidetector CT imaging of the abdomen and pelvis was performed following the standard protocol without IV contrast. RADIATION DOSE REDUCTION: This exam was performed according to the departmental dose-optimization program which includes automated exposure control, adjustment of the mA and/or kV according to patient size and/or use of iterative reconstruction technique. COMPARISON:  04/11/2022 FINDINGS: Lower chest: No acute abnormality Hepatobiliary: No focal liver abnormality is seen. Status post cholecystectomy. No biliary dilatation. Pancreas: No focal abnormality or ductal dilatation. Spleen: No focal abnormality.  Normal size. Adrenals/Urinary Tract: No adrenal abnormality. No focal  renal abnormality. No stones or hydronephrosis. Urinary bladder is unremarkable. Stomach/Bowel: Stomach, large and small bowel grossly unremarkable. Vascular/Lymphatic: No evidence of aneurysm or adenopathy. Reproductive: Prior hysterectomy.  No adnexal masses. Other: No free fluid or free air. Musculoskeletal: No acute bony abnormality. IMPRESSION: No renal or ureteral stones.  No hydronephrosis. No acute findings. Electronically Signed   By: Rolm Baptise M.D.   On: 06/15/2022 17:43    Procedures Procedures    Medications Ordered in ED Medications  oxyCODONE-acetaminophen (PERCOCET/ROXICET) 5-325 MG per tablet 1 tablet (1 tablet Oral Given 06/15/22 1627)  ondansetron (ZOFRAN-ODT) disintegrating tablet 8 mg (8 mg Oral Given 06/15/22 1627)  oxyCODONE-acetaminophen (PERCOCET/ROXICET) 5-325  MG per tablet 1 tablet (1 tablet Oral Given 06/15/22 1736)  HYDROmorphone (DILAUDID) injection 1 mg (1 mg Intravenous Given 06/15/22 1817)    ED Course/ Medical Decision Making/ A&P                              Medical Decision Making Amount and/or Complexity of Data Reviewed Labs: ordered. Radiology: ordered.  Risk Prescription drug management.   This patient presents to the ED for concern of right flank pain, this involves an extensive number of treatment options, and is a complaint that carries with it a high risk of complications and morbidity.  The differential diagnosis includes pyelonephritis, nephrolithiasis, acute fracture/dislocation, strain/sprain, aortic dissection,   Co morbidities that complicate the patient evaluation  See HPI   Additional history obtained:  Additional history obtained from EMR External records from outside source obtained and reviewed including hospital records   Lab Tests:  I Ordered, and personally interpreted labs.  The pertinent results include: No leukocytosis noted.  No evidence of anemia.  Platelets within normal range.  No electrolyte abnormalities.  No  renal dysfunction.  No transaminitis.  Lipase within normal limits.  UA significant for many bacteria, greater than 50 RBC, 6-10 WBC with 6-10 squamous epithelial cells otherwise undiscernible due to amount of blood   Imaging Studies ordered:  I ordered imaging studies including CT renal stone study I independently visualized and interpreted imaging which showed no acute abnormalities I agree with the radiologist interpretation   Cardiac Monitoring: / EKG:  The patient was maintained on a cardiac monitor.  I personally viewed and interpreted the cardiac monitored which showed an underlying rhythm of: Sinus rhythm   Consultations Obtained:  N/a   Problem List / ED Course / Critical interventions / Medication management  Right flank pain I ordered medication including, Zofran, Dilaudid   Reevaluation of the patient after these medicines showed that the patient improved I have reviewed the patients home medicines and have made adjustments as needed   Social Determinants of Health:  Denies tobacco, illicit drug use   Test / Admission - Considered:  Right flank pain, gross hematuria Vitals signs within normal range and stable throughout visit. Laboratory/imaging studies significant for: See above 42 year old female presents emergency department with complaints of right flank pain.  Patient now seen in the emergency department 11 times in the past 7 months for the same complaint. Patient without evidence of acute abnormality on CT imaging of the abdomen or pelvis.  Patient was with gross hematuria per urinalysis.  Unsure of exact etiology of patient's chronic right flank pain.  Low suspicion for aortic dissection/aneurysm.  Patient recommended follow-up with urology for reassessment of hematuria.  Treatment plan discussed length with patient and she acknowledged understanding was agreeable to said plan.  Patient overall well-appearing, afebrile in no acute distress. Worrisome signs  and symptoms were discussed with the patient, and the patient acknowledged understanding to return to the ED if noticed. Patient was stable upon discharge.          Final Clinical Impression(s) / ED Diagnoses Final diagnoses:  Flank pain  Hematuria, unspecified type    Rx / DC Orders ED Discharge Orders          Ordered    sulfamethoxazole-trimethoprim (BACTRIM DS) 800-160 MG tablet  2 times daily        06/15/22 1805  Wilnette Kales, [place] 06/15/22 1857    Tegeler, Gwenyth Allegra, MD 06/15/22 [phone]

## 2022-06-15 NOTE — Discharge Instructions (Addendum)
As discussed, workup today overall reassuring.  CT scan without evidence of kidney stone or abnormalities of the right kidney no other intra-abdominal abnormality.  As discussed, we will treat with antibiotics given some concern for infection.  Recommend follow-up with urology outpatient for reevaluation.  Attached is information to call to set up an appointment.  Please do not hesitate to return to emergency department the worrisome signs and symptoms we discussed become apparent.

## 2022-06-17 ENCOUNTER — Encounter (HOSPITAL_BASED_OUTPATIENT_CLINIC_OR_DEPARTMENT_OTHER): Payer: Self-pay | Admitting: Emergency Medicine

## 2022-06-17 ENCOUNTER — Emergency Department (HOSPITAL_BASED_OUTPATIENT_CLINIC_OR_DEPARTMENT_OTHER)
Admission: EM | Admit: 2022-06-17 | Discharge: 2022-06-18 | Disposition: A | Discharge status: Home / Self Care | Payer: Medicaid Other | Attending: Emergency Medicine | Admitting: Emergency Medicine

## 2022-06-17 ENCOUNTER — Other Ambulatory Visit: Payer: Self-pay

## 2022-06-17 DIAGNOSIS — R1011 Right upper quadrant pain: Secondary | ICD-10-CM | POA: Insufficient documentation

## 2022-06-17 DIAGNOSIS — R109 Unspecified abdominal pain: Secondary | ICD-10-CM | POA: Diagnosis present

## 2022-06-17 DIAGNOSIS — R319 Hematuria, unspecified: Secondary | ICD-10-CM | POA: Diagnosis not present

## 2022-06-17 LAB — CBC
HCT: 37.8 % (ref 36.0–46.0)
Hemoglobin: 13.4 g/dL (ref 12.0–15.0)
MCH: 28.9 pg (ref 26.0–34.0)
MCHC: 35.4 g/dL (ref 30.0–36.0)
MCV: 81.5 fL (ref 80.0–100.0)
Platelets: 390 10*3/uL (ref 150–400)
RBC: 4.64 MIL/uL (ref 3.87–5.11)
RDW: 14.5 % (ref 11.5–15.5)
WBC: 9.5 10*3/uL (ref 4.0–10.5)
nRBC: 0 % (ref 0.0–0.2)

## 2022-06-17 LAB — URINALYSIS, MICROSCOPIC (REFLEX): RBC / HPF: >50 RBC/hpf (ref 0–5)

## 2022-06-17 LAB — BASIC METABOLIC PANEL
Anion gap: 7 (ref 5–15)
BUN: 11 mg/dL (ref 6–20)
CO2: 24 mmol/L (ref 22–32)
Calcium: 8.4 mg/dL — ABNORMAL LOW (ref 8.9–10.3)
Chloride: 106 mmol/L (ref 98–111)
Creatinine, Ser: 0.86 mg/dL (ref 0.44–1.00)
GFR, Estimated: >60 mL/min (ref >60)
Glucose, Bld: 97 mg/dL (ref 70–99)
Potassium: 4 mmol/L (ref 3.5–5.1)
Sodium: 137 mmol/L (ref 135–145)

## 2022-06-17 MED ORDER — DROPERIDOL 2.5 MG/ML IJ SOLN
1.2500 mg | Freq: Once | INTRAMUSCULAR | Status: DC
  Filled 2022-06-17: qty 2

## 2022-06-17 MED ORDER — HYDROMORPHONE HCL 1 MG/ML IJ SOLN
1.0000 mg | Freq: Once | INTRAMUSCULAR | Status: AC
  Administered 2022-06-17: 1 mg via INTRAVENOUS
  Filled 2022-06-17: qty 1

## 2022-06-17 MED ORDER — SODIUM CHLORIDE 0.9 % IV BOLUS
1000.0000 mL | Freq: Once | INTRAVENOUS | Status: AC
  Administered 2022-06-17: 1000 mL via INTRAVENOUS

## 2022-06-17 MED ORDER — OXYCODONE HCL 5 MG PO TABS: 5.0000 mg | ORAL_TABLET | Freq: Four times a day (QID) | ORAL | 0 refills | Status: DC | PRN

## 2022-06-17 NOTE — ED Notes (Signed)
Pt here for hematuria ? Lupus flair Rt sided flank pain started 3 days ago.   States her  urine is pure blood.  Unable to void at present time Pain 10/10 rt flank that goes around to the front

## 2022-06-17 NOTE — ED Provider Notes (Signed)
[place] [facility] AT [facility] Provider Note   CSN: [id number] Arrival date & time: 06/17/22  2011     History  Chief Complaint  Patient presents with   Flank Pain    Amy Mathis is a 42 y.o. female.  With a history of lupus and IBS who presents to the ED for evaluation of right flank pain.  She states the symptoms have presents for the past 3 days.  She was seen 3 days ago for the same.  In fact, she has had 10 visits in the past 4 months for the same presentation.  She believes this is secondary to her lupus.  She has been unable to follow-up with rheumatology due to Medicaid problems.  States she will likely be unable to follow-up for at least 3 weeks from now.  She has been taking Tylenol at home for the pain with minimal relief.  She reports some nausea but denies vomiting, fevers, chills, chest pain, shortness of breath, dysuria, frequency, urgency.  Reports a sensation of incomplete voiding.  She states her symptoms feel exactly the same as when she has had symptoms in the past.  She was started on Bactrim at her previous visit and reports compliance with this.   Flank Pain       Home Medications Prior to Admission medications   Medication Sig Start Date End Date Taking? Authorizing Provider  oxyCODONE (ROXICODONE) 5 MG immediate release tablet Take 1 tablet (5 mg total) by mouth every 6 (six) hours as needed for severe pain. 06/17/22  Yes Maye Parkinson, [place] Folk, PA-C  cyclobenzaprine (FLEXERIL) 10 MG tablet Take 1 tablet (10 mg total) by mouth 2 (two) times daily as needed for muscle spasms. 06/15/22   Dion Saucier A, PA  diclofenac Sodium (VOLTAREN) 1 % GEL Apply 4 g topically 4 (four) times daily. Apply to affected areas 4 times daily as needed for pain. 09/24/21   Lynden Oxford Scales, PA-C  hydroxychloroquine (PLAQUENIL) 200 MG tablet Take by mouth 2 (two) times daily.    [provider]  methocarbamol (ROBAXIN) 500 MG tablet Take 1 tablet  (500 mg total) by mouth 2 (two) times daily. 05/11/22   Tacy Learn, PA-C  nitrofurantoin, macrocrystal-monohydrate, (MACROBID) 100 MG capsule Take 1 capsule (100 mg total) by mouth 2 (two) times daily. 10/29/21   Truddie Hidden, MD  ondansetron (ZOFRAN-ODT) 4 MG disintegrating tablet Take 1 tablet (4 mg total) by mouth every 8 (eight) hours as needed for nausea or vomiting. 05/11/22   Tacy Learn, PA-C  predniSONE (DELTASONE) 20 MG tablet Take 2.5 tablets (50 mg total) by mouth daily with breakfast for 6 days. 06/15/22 06/21/22  Wilnette Kales, PA  sulfamethoxazole-trimethoprim (BACTRIM DS) 800-160 MG tablet Take 1 tablet by mouth 2 (two) times daily for 7 days. 06/15/22 06/22/22  Wilnette Kales, PA      Allergies    Morphine and Penicillins    Review of Systems   Review of Systems  Genitourinary:  Positive for flank pain.  All other systems reviewed and are negative.   Physical Exam Updated Vital Signs BP 121/86   Pulse 100   Temp 98.6 F (37 C)   Resp 20   Ht 5\' 11"  (1.803 m)   Wt (!) 147.9 kg   SpO2 100%   BMI 45.47 kg/m  Physical Exam Vitals and nursing note reviewed.  Constitutional:      General: She is in acute distress.  Appearance: She is well-developed. She is obese. She is not ill-appearing, toxic-appearing or diaphoretic.  HENT:     Head: Normocephalic and atraumatic.  Eyes:     Conjunctiva/sclera: Conjunctivae normal.  Cardiovascular:     Rate and Rhythm: Normal rate and regular rhythm.     Heart sounds: No murmur heard. Pulmonary:     Effort: Pulmonary effort is normal. No respiratory distress.     Breath sounds: Normal breath sounds.  Abdominal:     Palpations: Abdomen is soft.     Tenderness: There is abdominal tenderness (Right upper quadrant). There is right CVA tenderness. There is no left CVA tenderness or guarding.  Musculoskeletal:        General: No swelling.     Cervical back: Neck supple.  Skin:    General: Skin is warm and dry.      Capillary Refill: Capillary refill takes less than 2 seconds.  Neurological:     General: No focal deficit present.     Mental Status: She is alert and oriented to person, place, and time.  Psychiatric:        Mood and Affect: Mood normal.     ED Results / Procedures / Treatments   Labs (all labs ordered are listed, but only abnormal results are displayed) Labs Reviewed  URINALYSIS, ROUTINE W REFLEX MICROSCOPIC - Abnormal; Notable for the following components:      Result Value   Color, Urine RED (*)    APPearance CLOUDY (*)    Glucose, UA   (*)    Value: TEST NOT REPORTED DUE TO COLOR INTERFERENCE OF URINE PIGMENT   Hgb urine dipstick   (*)    Value: TEST NOT REPORTED DUE TO COLOR INTERFERENCE OF URINE PIGMENT   Bilirubin Urine   (*)    Value: TEST NOT REPORTED DUE TO COLOR INTERFERENCE OF URINE PIGMENT   Ketones, ur   (*)    Value: TEST NOT REPORTED DUE TO COLOR INTERFERENCE OF URINE PIGMENT   Protein, ur   (*)    Value: TEST NOT REPORTED DUE TO COLOR INTERFERENCE OF URINE PIGMENT   Nitrite   (*)    Value: TEST NOT REPORTED DUE TO COLOR INTERFERENCE OF URINE PIGMENT   Leukocytes,Ua   (*)    Value: TEST NOT REPORTED DUE TO COLOR INTERFERENCE OF URINE PIGMENT   All other components within normal limits  BASIC METABOLIC PANEL - Abnormal; Notable for the following components:   Calcium 8.4 (*)    All other components within normal limits  URINALYSIS, MICROSCOPIC (REFLEX) - Abnormal; Notable for the following components:   Bacteria, UA MANY (*)    All other components within normal limits  CBC    EKG None  Radiology No results found.  Procedures Procedures    Medications Ordered in ED Medications  droperidol (INAPSINE) 2.5 MG/ML injection 1.25 mg (1.25 mg Intravenous Not Given 06/17/22 2206)  sodium chloride 0.9 % bolus 1,000 mL ( Intravenous Stopped 06/17/22 2253)  HYDROmorphone (DILAUDID) injection 1 mg (1 mg Intravenous Given 06/17/22 2143)  HYDROmorphone  (DILAUDID) injection 1 mg (1 mg Intravenous Given 06/17/22 2306)    ED Course/ Medical Decision Making/ A&P                             Medical Decision Making Amount and/or Complexity of Data Reviewed Labs: ordered.  Risk Prescription drug management.  This patient presents to the ED for  concern of right flank pain, this involves an extensive number of treatment options, and is a complaint that carries with it a high risk of complications and morbidity.  The differential diagnosis includes lupus nephritis, strain, sprain, pyelonephritis, nephrolithiasis  Co morbidities that complicate the patient evaluation   lupus, IBS  My initial workup includes labs, fluids, pain control, nausea control  Additional history obtained from: Nursing notes from this visit. Previous records within EMR system ED visits on 06/15/2022, 05/23/2022, 05/22/2022, 05/11/2022, 04/18/2022, 04/16/2022, 04/11/2022, 03/18/2022, 03/04/2023 for evaluation of similar  I ordered, reviewed and interpreted labs which include: BMP, CBC, urinalysis.  Urinalysis with frank hematuria, many bacteria.  This is similar to previous studies.  CBC within normal limits.  Urine culture from 3 days ago grew E. coli and E faecalis  I reviewed imaging studies including CT stone study from 06/15/2022 I independently visualized and interpreted imaging which showed normal I agree with the radiologist interpretation  Afebrile, hemodynamically stable.  42 year old female presenting to the ED for evaluation of chronic right flank pain.  Believes it is from her lupus.  Has been unable to follow-up with rheumatology.  Has had numerous visits for this recently.  States that symptoms feel exactly the same as previous visits.  She has had multiple CT scans, most recently 3 days ago.  Feel it is unnecessary to rescan her today.  She reported improvement in her symptoms after pain medication.  She was encouraged to follow-up with Medicaid and to get an appointment  with rheumatology as soon as possible.  Low suspicion for acute intra-abdominal abnormalities.  She was given return precautions.  She was given short course of oxycodone for pain and educated on side effects.  She was encouraged to follow-up with rheumatology soon as possible.  Stable discharge.  At this time there does not appear to be any evidence of an acute emergency medical condition and the patient appears stable for discharge with appropriate outpatient follow up. Diagnosis was discussed with patient who verbalizes understanding of care plan and is agreeable to discharge. I have discussed return precautions with patient who verbalizes understanding. Patient encouraged to follow-up with their PCP within 1 week. All questions answered.  Note: Portions of this report may have been transcribed using voice recognition software. Every effort was made to ensure accuracy; however, inadvertent computerized transcription errors may still be present.        Final Clinical Impression(s) / ED Diagnoses Final diagnoses:  Right flank pain    Rx / DC Orders ED Discharge Orders          Ordered    oxyCODONE (ROXICODONE) 5 MG immediate release tablet  Every 6 hours PRN        06/17/22 2359              Roylene Reason, PA-C 06/18/22 0001    Audley Hose, MD 06/18/22 [phone]

## 2022-06-17 NOTE — Discharge Instructions (Addendum)
You have been seen today for your complaint of right flank pain. Your lab work was overall reassuring. Your discharge medications include the medications that were prescribed for you 3 days ago. Oxycodone. This is an opioid pain medication. You should only take this medication as needed for severe pain. You should not drive, operate heavy machinery or make important decisions while taking this medication. You should use alternative methods for pain relief while taking this medication including stretching, gentle range of motion, and tylenol. Follow up with: Your primary care provider in 1 week for reevaluation of your symptoms Please seek immediate medical care if you develop any of the following symptoms: You have trouble breathing or you are short of breath. Your abdomen hurts or it is swollen or red. You have nausea or vomiting. You feel faint, or you faint. You have flank pain and a fever. At this time there does not appear to be the presence of an emergent medical condition, however there is always the potential for conditions to change. Please read and follow the below instructions.  Do not take your medicine if  develop an itchy rash, swelling in your mouth or lips, or difficulty breathing; call 911 and seek immediate emergency medical attention if this occurs.  You may review your lab tests and imaging results in their entirety on your MyChart account.  Please discuss all results of fully with your primary care provider and other specialist at your follow-up visit.  Note: Portions of this text may have been transcribed using voice recognition software. Every effort was made to ensure accuracy; however, inadvertent computerized transcription errors may still be present.

## 2022-06-17 NOTE — ED Triage Notes (Signed)
Patient arrived via POV c/o right flank pain x 3 days. Patient states hx of lupus with kidney involvement and kidney stones. Patient states hematuria as well. Patient states 9/10 pain. Patient is AO x 4, VS WDL, normal gait.

## 2022-06-18 LAB — URINE CULTURE: Culture: >=100000 — AB

## 2022-06-19 ENCOUNTER — Telehealth (HOSPITAL_BASED_OUTPATIENT_CLINIC_OR_DEPARTMENT_OTHER): Payer: Self-pay

## 2022-06-19 NOTE — Progress Notes (Signed)
ED Antimicrobial Stewardship Positive Culture Follow Up   Amy Mathis is an 42 y.o. female who presented to [facility] on 06/17/2022 with a chief complaint of right flank pain x3 days, nausea, and hematuria. She initially presented on 3/30 with these symptoms and was prescribed Bactrim at discharge. On 4/1, her symptoms were thought to be attributed to her lupus, so she was prescribed oxycodone at discharge.   Chief Complaint  Patient presents with   Flank Pain    Recent Results (from the past 720 hour(s))  Urine Culture     Status: Abnormal   Collection Time: 06/15/22  6:04 PM   Specimen: Urine, Clean Catch  Result Value Ref Range Status   Specimen Description   Final    URINE, CLEAN CATCH Performed at [facility], [address] [place], [place] [address]    Special Requests   Final    NONE Performed at [facility], [address] [place], [place] [address]    Culture (A)  Final    >=100,000 COLONIES/mL ESCHERICHIA COLI >=100,000 COLONIES/mL ENTEROCOCCUS FAECALIS    Report Status 06/18/2022 FINAL  Final   Organism ID, Bacteria ESCHERICHIA COLI (A)  Final   Organism ID, Bacteria ENTEROCOCCUS FAECALIS (A)  Final      Susceptibility   Escherichia coli - MIC*    AMPICILLIN 4 SENSITIVE Sensitive     CEFAZOLIN <=4 SENSITIVE Sensitive     CEFEPIME <=0.12 SENSITIVE Sensitive     CEFTRIAXONE <=0.25 SENSITIVE Sensitive     CIPROFLOXACIN <=0.25 SENSITIVE Sensitive     GENTAMICIN <=1 SENSITIVE Sensitive     IMIPENEM <=0.25 SENSITIVE Sensitive     NITROFURANTOIN <=16 SENSITIVE Sensitive     TRIMETH/SULFA <=20 SENSITIVE Sensitive     AMPICILLIN/SULBACTAM <=2 SENSITIVE Sensitive     PIP/TAZO <=4 SENSITIVE Sensitive     * >=100,000 COLONIES/mL ESCHERICHIA COLI   Enterococcus faecalis - MIC*    AMPICILLIN <=2 SENSITIVE Sensitive     NITROFURANTOIN <=16 SENSITIVE Sensitive     VANCOMYCIN 2 SENSITIVE Sensitive     * >=100,000 COLONIES/mL ENTEROCOCCUS FAECALIS     [x]  Treated with Bactrim, enterococcus faecalis resistant to prescribed antimicrobial []  Patient discharged originally without antimicrobial agent and treatment is now indicated  New antibiotic prescription: Continue Bactrim prescription. Start nitrofurantoin 100mg  BID for 7 days.   ED Provider: Godfrey Pick, MD    Billey Gosling, PharmD PGY1 Pharmacy Resident 4/3/20248:01 AM

## 2022-06-19 NOTE — Telephone Encounter (Signed)
Post ED Visit - Positive Culture Follow-up: Unsuccessful Patient Follow-up  Culture assessed and recommendations reviewed by:  []  Elenor Quinones, Pharm.D. []  Heide Guile, Pharm.D., BCPS AQ-ID []  Parks Neptune, Pharm.D., BCPS []  Alycia Rossetti, Pharm.D., BCPS []  [place], [place].D., BCPS, AAHIVP []  Legrand Como, Pharm.D., BCPS, AAHIVP []  Wynell Balloon, PharmD []  Vincenza Hews, PharmD, BCPS  Positive urine culture  []  Patient discharged without antimicrobial prescription and treatment is now indicated [x]  Organism is resistant to prescribed ED discharge antimicrobial []  Patient with positive blood cultures  Plan: Start Nitrofurantoin 100 mg po BID for 7 days. Per ED provider Godfrey Pick, MD  Unable to contact patient after 3 attempts, letter will be sent to address on file  Glennon Hamilton 06/19/2022, 11:59 AM

## 2022-06-24 LAB — URINALYSIS, ROUTINE W REFLEX MICROSCOPIC

## 2022-07-06 ENCOUNTER — Emergency Department (HOSPITAL_BASED_OUTPATIENT_CLINIC_OR_DEPARTMENT_OTHER)
Admission: EM | Admit: 2022-07-06 | Discharge: 2022-07-06 | Disposition: A | Discharge status: Home / Self Care | Payer: Medicaid Other | Attending: Emergency Medicine | Admitting: Emergency Medicine

## 2022-07-06 ENCOUNTER — Other Ambulatory Visit: Payer: Self-pay

## 2022-07-06 ENCOUNTER — Encounter (HOSPITAL_BASED_OUTPATIENT_CLINIC_OR_DEPARTMENT_OTHER): Payer: Self-pay | Admitting: Emergency Medicine

## 2022-07-06 DIAGNOSIS — R109 Unspecified abdominal pain: Secondary | ICD-10-CM | POA: Diagnosis not present

## 2022-07-06 DIAGNOSIS — A599 Trichomoniasis, unspecified: Secondary | ICD-10-CM

## 2022-07-06 DIAGNOSIS — R000 Tachycardia, unspecified: Secondary | ICD-10-CM | POA: Diagnosis not present

## 2022-07-06 LAB — CBC WITH DIFFERENTIAL/PLATELET
Abs Immature Granulocytes: 0.03 10*3/uL (ref 0.00–0.07)
Basophils Absolute: 0.1 10*3/uL (ref 0.0–0.1)
Basophils Relative: 1 %
Eosinophils Absolute: 0.2 10*3/uL (ref 0.0–0.5)
Eosinophils Relative: 2 %
HCT: 36.1 % (ref 36.0–46.0)
Hemoglobin: 13.1 g/dL (ref 12.0–15.0)
Immature Granulocytes: 0 %
Lymphocytes Relative: 30 %
Lymphs Abs: 2.8 10*3/uL (ref 0.7–4.0)
MCH: 29.4 pg (ref 26.0–34.0)
MCHC: 36.3 g/dL — ABNORMAL HIGH (ref 30.0–36.0)
MCV: 81.1 fL (ref 80.0–100.0)
Monocytes Absolute: 0.5 10*3/uL (ref 0.1–1.0)
Monocytes Relative: 6 %
Neutro Abs: 5.7 10*3/uL (ref 1.7–7.7)
Neutrophils Relative %: 61 %
Platelets: 377 10*3/uL (ref 150–400)
RBC: 4.45 MIL/uL (ref 3.87–5.11)
RDW: 14.7 % (ref 11.5–15.5)
WBC: 9.2 10*3/uL (ref 4.0–10.5)
nRBC: 0 % (ref 0.0–0.2)

## 2022-07-06 LAB — URINALYSIS, MICROSCOPIC (REFLEX): RBC / HPF: >50 RBC/hpf (ref 0–5)

## 2022-07-06 LAB — COMPREHENSIVE METABOLIC PANEL
ALT: 17 U/L (ref 0–44)
AST: 26 U/L (ref 15–41)
Albumin: 3.6 g/dL (ref 3.5–5.0)
Alkaline Phosphatase: 58 U/L (ref 38–126)
Anion gap: 9 (ref 5–15)
BUN: 15 mg/dL (ref 6–20)
CO2: 23 mmol/L (ref 22–32)
Calcium: 9.2 mg/dL (ref 8.9–10.3)
Chloride: 105 mmol/L (ref 98–111)
Creatinine, Ser: 0.84 mg/dL (ref 0.44–1.00)
GFR, Estimated: >60 mL/min (ref >60)
Glucose, Bld: 128 mg/dL — ABNORMAL HIGH (ref 70–99)
Potassium: 4.4 mmol/L (ref 3.5–5.1)
Sodium: 137 mmol/L (ref 135–145)
Total Bilirubin: 0.2 mg/dL — ABNORMAL LOW (ref 0.3–1.2)
Total Protein: 8.4 g/dL — ABNORMAL HIGH (ref 6.5–8.1)

## 2022-07-06 LAB — URINALYSIS, ROUTINE W REFLEX MICROSCOPIC

## 2022-07-06 MED ORDER — ONDANSETRON 4 MG PO TBDP: 4.0000 mg | ORAL_TABLET | Freq: Three times a day (TID) | ORAL | 0 refills | Status: DC | PRN

## 2022-07-06 MED ORDER — OXYCODONE HCL 5 MG PO TABS: 5.0000 mg | ORAL_TABLET | Freq: Four times a day (QID) | ORAL | 0 refills | Status: DC | PRN

## 2022-07-06 MED ORDER — OXYCODONE HCL 5 MG PO TABS
10.0000 mg | ORAL_TABLET | Freq: Once | ORAL | Status: AC
  Administered 2022-07-06: 10 mg via ORAL
  Filled 2022-07-06: qty 2

## 2022-07-06 MED ORDER — ONDANSETRON 4 MG PO TBDP
4.0000 mg | ORAL_TABLET | Freq: Once | ORAL | Status: AC
  Administered 2022-07-06: 4 mg via ORAL
  Filled 2022-07-06: qty 1

## 2022-07-06 MED ORDER — METRONIDAZOLE 500 MG PO TABS: 500.0000 mg | ORAL_TABLET | Freq: Two times a day (BID) | ORAL | 0 refills | Status: DC

## 2022-07-06 NOTE — ED Notes (Signed)
ED Provider at bedside. 

## 2022-07-06 NOTE — ED Provider Notes (Signed)
[facility] Provider Note   CSN: [id number] Arrival date & time: 07/06/22  1314     History  Chief Complaint  Patient presents with   Flank Pain    Amy Mathis is a 42 y.o. female.  Patient with history of lupus on Plaquenil and prednisone, hematuria, chronic flank pain --presents today for apparent flare of her chronic symptoms.  She states that she vomited once yesterday.  She notes that this has been typical preceding her flares recently.  She awoke at around 3 AM today with right-sided flank pain with radiation to the right side of her abdomen.  No additional vomiting.  She has been drinking water.  She took NyQuil last night in an attempt to sleep, but this did not really help.  She presents to the emergency department for symptom control.  No fevers, chest pain, shortness of breath.  She has history of irritable bowel syndrome and has frequent diarrhea and continues to have this as well.  She has tried Tylenol without improvement.      Home Medications Prior to Admission medications   Medication Sig Start Date End Date Taking? Authorizing Provider  cyclobenzaprine (FLEXERIL) 10 MG tablet Take 1 tablet (10 mg total) by mouth 2 (two) times daily as needed for muscle spasms. 06/15/22   Sherian Maroon A, PA  diclofenac Sodium (VOLTAREN) 1 % GEL Apply 4 g topically 4 (four) times daily. Apply to affected areas 4 times daily as needed for pain. 09/24/21   Theadora Rama Scales, PA-C  hydroxychloroquine (PLAQUENIL) 200 MG tablet Take by mouth 2 (two) times daily.    [provider]  methocarbamol (ROBAXIN) 500 MG tablet Take 1 tablet (500 mg total) by mouth 2 (two) times daily. 05/11/22   Jeannie Fend, PA-C  nitrofurantoin, macrocrystal-monohydrate, (MACROBID) 100 MG capsule Take 1 capsule (100 mg total) by mouth 2 (two) times daily. 10/29/21   Pollyann Savoy, MD  ondansetron (ZOFRAN-ODT) 4 MG disintegrating tablet Take 1  tablet (4 mg total) by mouth every 8 (eight) hours as needed for nausea or vomiting. 05/11/22   Jeannie Fend, PA-C  oxyCODONE (ROXICODONE) 5 MG immediate release tablet Take 1 tablet (5 mg total) by mouth every 6 (six) hours as needed for severe pain. 06/17/22   Schutt, Edsel Petrin, PA-C      Allergies    Morphine and Penicillins    Review of Systems   Review of Systems  Physical Exam Updated Vital Signs BP (!) 151/86 (BP Location: Right Arm)   Pulse (!) 108   Temp 98.5 F (36.9 C) (Oral)   Resp (!) 24   Ht  (1.803 m)   Wt (!) 147.9 kg   SpO2 97%   BMI 45.48 kg/m   Physical Exam Vitals and nursing note reviewed.  Constitutional:      General: She is not in acute distress.    Appearance: She is well-developed.  HENT:     Head: Normocephalic and atraumatic.     Right Ear: External ear normal.     Left Ear: External ear normal.     Nose: Nose normal.  Eyes:     Conjunctiva/sclera: Conjunctivae normal.  Cardiovascular:     Rate and Rhythm: Regular rhythm. Tachycardia present.     Heart sounds: No murmur heard.    Comments: Mild tachycardia noted no murmurs rubs or gallops Pulmonary:     Effort: No respiratory distress.     Breath  sounds: No wheezing, rhonchi or rales.     Comments: Lungs are clear, patient in no respiratory distress Abdominal:     Palpations: Abdomen is soft.     Tenderness: There is abdominal tenderness. There is no guarding or rebound.     Comments: Patient with right-sided abdominal tenderness to palpation, no evidence of rebound or guarding.  Musculoskeletal:     Cervical back: Normal range of motion and neck supple.     Right lower leg: No edema.     Left lower leg: No edema.  Skin:    General: Skin is warm and dry.     Findings: No rash.  Neurological:     General: No focal deficit present.     Mental Status: She is alert. Mental status is at baseline.     Motor: No weakness.  Psychiatric:        Mood and Affect: Mood normal.     ED Results / Procedures / Treatments   Labs (all labs ordered are listed, but only abnormal results are displayed) Labs Reviewed  URINALYSIS, ROUTINE W REFLEX MICROSCOPIC - Abnormal; Notable for the following components:      Result Value   Color, Urine RED (*)    APPearance TURBID (*)    Glucose, UA   (*)    Value: TEST NOT REPORTED DUE TO COLOR INTERFERENCE OF URINE PIGMENT   Hgb urine dipstick   (*)    Value: TEST NOT REPORTED DUE TO COLOR INTERFERENCE OF URINE PIGMENT   Bilirubin Urine   (*)    Value: TEST NOT REPORTED DUE TO COLOR INTERFERENCE OF URINE PIGMENT   Ketones, ur   (*)    Value: TEST NOT REPORTED DUE TO COLOR INTERFERENCE OF URINE PIGMENT   Protein, ur   (*)    Value: TEST NOT REPORTED DUE TO COLOR INTERFERENCE OF URINE PIGMENT   Nitrite   (*)    Value: TEST NOT REPORTED DUE TO COLOR INTERFERENCE OF URINE PIGMENT   Leukocytes,Ua   (*)    Value: TEST NOT REPORTED DUE TO COLOR INTERFERENCE OF URINE PIGMENT   All other components within normal limits  CBC WITH DIFFERENTIAL/PLATELET - Abnormal; Notable for the following components:   MCHC 36.3 (*)    All other components within normal limits  COMPREHENSIVE METABOLIC PANEL - Abnormal; Notable for the following components:   Glucose, Bld 128 (*)    Total Protein 8.4 (*)    Total Bilirubin 0.2 (*)    All other components within normal limits  URINALYSIS, MICROSCOPIC (REFLEX) - Abnormal; Notable for the following components:   Bacteria, UA MANY (*)    Trichomonas, UA PRESENT (*)    All other components within normal limits  GC/CHLAMYDIA PROBE AMP ([place]) NOT AT [facility]    EKG None  Radiology No results found.  Procedures Procedures    Medications Ordered in ED Medications  oxyCODONE (Oxy IR/ROXICODONE) immediate release tablet 10 mg (10 mg Oral Given 07/06/22 1411)  ondansetron (ZOFRAN-ODT) disintegrating tablet 4 mg (4 mg Oral Given 07/06/22 1410)    ED Course/ Medical Decision Making/ A&P     Patient seen and examined. History obtained directly from patient.   Labs/EKG: Ordered CBC, CMP, UA.  Imaging: None ordered  Medications/Fluids: Ordered: P.o. oxycodone x 1, ODT Zofran  Most recent vital signs reviewed and are as follows: BP (!) 151/86 (BP Location: Right Arm)   Pulse (!) 108   Temp 98.5 F (36.9 C) (Oral)  Resp (!) 24   Ht 5\' 11"  (1.803 m)   Wt (!) 147.9 kg   SpO2 97%   BMI 45.48 kg/m   Initial impression: Acute on chronic abdominal and flank pain in setting of lupus  4:22 PM Reassessment performed. Patient appears stable.  Looks more comfortable.  Labs personally reviewed and interpreted including: CBC with normal white blood cell count, normal hemoglobin; CMP with electrolytes kidney function normal, normal liver function testing; UA with blood noted as well as trichomonas.  Reviewed pertinent lab work and imaging with patient at bedside. Questions answered.   She states that she has a previous diagnosis of trichomonas when she was pregnant.  States that she is sexually active with her husband.  We discussed additional testing for sexually transmitted infection and treatment of trichomonas with course of antibiotics.  Most current vital signs reviewed and are as follows: BP (!) 122/93   Pulse 97   Temp 98.5 F (36.9 C) (Oral)   Resp 16   Ht 5\' 11"  (1.803 m)   Wt (!) 147.9 kg   SpO2 98%   BMI 45.48 kg/m   Plan: Discharge to home.   Prescriptions written for: Zofran for nausea and vomiting, metronidazole for treatment of trichomoniasis, oxycodone for pain # 5 tablets.  Other home care instructions discussed: Parke Simmers diet, continued use of antiemetics as needed, continue to maintain good hydration.  ED return instructions discussed: The patient was urged to return to the Emergency Department immediately with worsening of current symptoms, worsening abdominal pain, persistent vomiting, blood noted in stools, fever, or any other concerns. The patient  verbalized understanding.   Follow-up instructions discussed: Patient encouraged to follow-up with their PCP in 3 days. Referral given as well.                               Medical Decision Making Amount and/or Complexity of Data Reviewed Labs: ordered.  Risk Prescription drug management.   For this patient's complaint of abdominal pain, the following conditions were considered on the differential diagnosis: gastritis/PUD, enteritis/duodenitis, appendicitis, cholelithiasis/cholecystitis, cholangitis, pancreatitis, ruptured viscus, colitis, diverticulitis, small/large bowel obstruction, proctitis, cystitis, pyelonephritis, ureteral colic, aortic dissection, aortic aneurysm. In women, ectopic pregnancy, pelvic inflammatory disease, ovarian cysts, and tubo-ovarian abscess were also considered. Atypical chest etiologies were also considered including ACS, PE, and pneumonia.  Patient's labs are stable today and consistent with previous.  She does have diagnosis of trichomoniasis.  Additional urine testing for gonorrhea and chlamydia sent and pending.  No pelvic pain or discharge to suggest PID today.  The patient's vital signs, pertinent lab work and imaging were reviewed and interpreted as discussed in the ED course. Hospitalization was considered for further testing, treatments, or serial exams/observation. However as patient is well-appearing, has a stable exam, and reassuring studies today, I do not feel that they warrant admission at this time. This plan was discussed with the patient who verbalizes agreement and comfort with this plan and seems reliable and able to return to the Emergency Department with worsening or changing symptoms.          Final Clinical Impression(s) / ED Diagnoses Final diagnoses:  Flank pain  Trichomoniasis    Rx / DC Orders ED Discharge Orders          Ordered    oxyCODONE (OXY IR/ROXICODONE) 5 MG immediate release tablet  Every 6 hours PRN         07/06/22  1613    ondansetron (ZOFRAN-ODT) 4 MG disintegrating tablet  Every 8 hours PRN        07/06/22 1613    metroNIDAZOLE (FLAGYL) 500 MG tablet  2 times daily        07/06/22 1613              Renne Crigler, PA-C 07/06/22 1625    Benjiman Core, MD 07/07/22 [phone]

## 2022-07-06 NOTE — ED Triage Notes (Signed)
Pt reports RT flank pain that started around 0300;  reports vomited x 1 yesterday

## 2022-07-06 NOTE — Discharge Instructions (Signed)
Please read and follow all provided instructions.  Your diagnoses today include:  1. Flank pain   2. Trichomoniasis     Tests performed today include: Blood cell counts and platelets: Normal blood cell counts Kidney and liver function tests: Normal electrolytes Pancreas function test (called lipase): Normal Urine test to look for infection: Blood noted and trichomonas as well Gonorrhea and Chlamydia test: Results pending Vital signs. See below for your results today.   Medications prescribed:  Metronidazole - antibiotic  You have been prescribed an antibiotic medicine: take the entire course of medicine even if you are feeling better. Stopping early can cause the antibiotic not to work. Do not drink alcohol when taking this medication.   Oxycodone - narcotic pain medication  DO NOT drive or perform any activities that require you to be awake and alert because this medicine can make you drowsy.   Zofran (ondansetron) - for nausea and vomiting  Take any prescribed medications only as directed.  Home care instructions:  Follow any educational materials contained in this packet.  Follow-up instructions: Please follow-up with your primary care provider in the next 3 days for further evaluation of your symptoms.    Return instructions:  SEEK IMMEDIATE MEDICAL ATTENTION IF: The pain does not go away or becomes severe  A temperature above 101F develops  Repeated vomiting occurs (multiple episodes)  The pain becomes localized to portions of the abdomen. The right side could possibly be appendicitis. In an adult, the left lower portion of the abdomen could be colitis or diverticulitis.  Blood is being passed in stools or vomit (bright red or black tarry stools)  You develop chest pain, difficulty breathing, dizziness or fainting, or become confused, poorly responsive, or inconsolable (young children) If you have any other emergent concerns regarding your health  Additional  Information: Abdominal (belly) pain can be caused by many things. Your caregiver performed an examination and possibly ordered blood/urine tests and imaging (CT scan, x-rays, ultrasound). Many cases can be observed and treated at home after initial evaluation in the emergency department. Even though you are being discharged home, abdominal pain can be unpredictable. Therefore, you need a repeated exam if your pain does not resolve, returns, or worsens. Most patients with abdominal pain don't have to be admitted to the hospital or have surgery, but serious problems like appendicitis and gallbladder attacks can start out as nonspecific pain. Many abdominal conditions cannot be diagnosed in one visit, so follow-up evaluations are very important.  Your vital signs today were: BP (!) 122/93   Pulse 97   Temp 98.5 F (36.9 C) (Oral)   Resp 16   Ht  (1.803 m)   Wt (!) 147.9 kg   SpO2 98%   BMI 45.48 kg/m  If your blood pressure (bp) was elevated above 135/85 this visit, please have this repeated by your doctor within one month. --------------

## 2022-07-08 LAB — GC/CHLAMYDIA PROBE AMP (~~LOC~~) NOT AT ARMC
Chlamydia: NEGATIVE
Comment: NEGATIVE
Comment: NORMAL
Neisseria Gonorrhea: NEGATIVE

## 2022-07-15 ENCOUNTER — Other Ambulatory Visit: Payer: Self-pay

## 2022-07-15 ENCOUNTER — Encounter (HOSPITAL_BASED_OUTPATIENT_CLINIC_OR_DEPARTMENT_OTHER): Payer: Self-pay

## 2022-07-15 DIAGNOSIS — R310 Gross hematuria: Secondary | ICD-10-CM | POA: Diagnosis not present

## 2022-07-15 DIAGNOSIS — R112 Nausea with vomiting, unspecified: Secondary | ICD-10-CM | POA: Diagnosis not present

## 2022-07-15 DIAGNOSIS — R109 Unspecified abdominal pain: Secondary | ICD-10-CM | POA: Insufficient documentation

## 2022-07-15 DIAGNOSIS — R319 Hematuria, unspecified: Secondary | ICD-10-CM | POA: Diagnosis present

## 2022-07-15 NOTE — ED Triage Notes (Signed)
Pt has had hematuria for 2 weeks with right flank/ABD pain with vomiting x 3 days.  Pt has hx of Lupus and feels "this is a flare up".

## 2022-07-16 ENCOUNTER — Emergency Department (HOSPITAL_BASED_OUTPATIENT_CLINIC_OR_DEPARTMENT_OTHER)
Admission: EM | Admit: 2022-07-16 | Discharge: 2022-07-16 | Disposition: A | Discharge status: Home / Self Care | Payer: Medicaid Other | Attending: Emergency Medicine | Admitting: Emergency Medicine

## 2022-07-16 DIAGNOSIS — R310 Gross hematuria: Secondary | ICD-10-CM

## 2022-07-16 DIAGNOSIS — R109 Unspecified abdominal pain: Secondary | ICD-10-CM

## 2022-07-16 LAB — CBC WITH DIFFERENTIAL/PLATELET
Abs Immature Granulocytes: 0.04 10*3/uL (ref 0.00–0.07)
Basophils Absolute: 0 10*3/uL (ref 0.0–0.1)
Basophils Relative: 0 %
Eosinophils Absolute: 0.2 10*3/uL (ref 0.0–0.5)
Eosinophils Relative: 1 %
HCT: 40.4 % (ref 36.0–46.0)
Hemoglobin: 14.1 g/dL (ref 12.0–15.0)
Immature Granulocytes: 0 %
Lymphocytes Relative: 28 %
Lymphs Abs: 3.1 10*3/uL (ref 0.7–4.0)
MCH: 28.7 pg (ref 26.0–34.0)
MCHC: 34.9 g/dL (ref 30.0–36.0)
MCV: 82.1 fL (ref 80.0–100.0)
Monocytes Absolute: 0.5 10*3/uL (ref 0.1–1.0)
Monocytes Relative: 5 %
Neutro Abs: 7.1 10*3/uL (ref 1.7–7.7)
Neutrophils Relative %: 66 %
Platelets: 418 10*3/uL — ABNORMAL HIGH (ref 150–400)
RBC: 4.92 MIL/uL (ref 3.87–5.11)
RDW: 14.6 % (ref 11.5–15.5)
WBC: 11 10*3/uL — ABNORMAL HIGH (ref 4.0–10.5)
nRBC: 0 % (ref 0.0–0.2)

## 2022-07-16 LAB — URINALYSIS, ROUTINE W REFLEX MICROSCOPIC

## 2022-07-16 LAB — COMPREHENSIVE METABOLIC PANEL
ALT: 18 U/L (ref 0–44)
AST: 29 U/L (ref 15–41)
Albumin: 3.9 g/dL (ref 3.5–5.0)
Alkaline Phosphatase: 58 U/L (ref 38–126)
Anion gap: 12 (ref 5–15)
BUN: 8 mg/dL (ref 6–20)
CO2: 26 mmol/L (ref 22–32)
Calcium: 9.2 mg/dL (ref 8.9–10.3)
Chloride: 99 mmol/L (ref 98–111)
Creatinine, Ser: 0.83 mg/dL (ref 0.44–1.00)
GFR, Estimated: >60 mL/min (ref >60)
Glucose, Bld: 132 mg/dL — ABNORMAL HIGH (ref 70–99)
Potassium: 3.5 mmol/L (ref 3.5–5.1)
Sodium: 137 mmol/L (ref 135–145)
Total Bilirubin: 0.4 mg/dL (ref 0.3–1.2)
Total Protein: 9.1 g/dL — ABNORMAL HIGH (ref 6.5–8.1)

## 2022-07-16 LAB — URINALYSIS, MICROSCOPIC (REFLEX)

## 2022-07-16 LAB — LIPASE, BLOOD: Lipase: 28 U/L (ref 11–51)

## 2022-07-16 MED ORDER — ONDANSETRON HCL 4 MG/2ML IJ SOLN
4.0000 mg | Freq: Once | INTRAMUSCULAR | Status: AC
  Administered 2022-07-16: 4 mg via INTRAVENOUS
  Filled 2022-07-16: qty 2

## 2022-07-16 MED ORDER — PREDNISONE 20 MG PO TABS: 50.0000 mg | ORAL_TABLET | Freq: Every day | ORAL | 0 refills | Status: DC

## 2022-07-16 MED ORDER — HYDROMORPHONE HCL 1 MG/ML IJ SOLN
1.0000 mg | Freq: Once | INTRAMUSCULAR | Status: AC
  Administered 2022-07-16: 1 mg via INTRAVENOUS
  Filled 2022-07-16: qty 1

## 2022-07-16 MED ORDER — OXYCODONE HCL 5 MG PO TABS: 5.0000 mg | ORAL_TABLET | Freq: Four times a day (QID) | ORAL | 0 refills | Status: DC | PRN

## 2022-07-16 MED ORDER — PROMETHAZINE HCL 25 MG PO TABS: 25.0000 mg | ORAL_TABLET | Freq: Four times a day (QID) | ORAL | 0 refills | Status: DC | PRN

## 2022-07-16 MED ORDER — SODIUM CHLORIDE 0.9 % IV SOLN
25.0000 mg | Freq: Four times a day (QID) | INTRAVENOUS | Status: DC | PRN
  Administered 2022-07-16: 25 mg via INTRAVENOUS
  Filled 2022-07-16: qty 1

## 2022-07-16 MED ORDER — FENTANYL CITRATE PF 50 MCG/ML IJ SOSY
100.0000 ug | PREFILLED_SYRINGE | Freq: Once | INTRAMUSCULAR | Status: AC
  Administered 2022-07-16: 100 ug via INTRAVENOUS
  Filled 2022-07-16: qty 2

## 2022-07-16 MED ORDER — SODIUM CHLORIDE 0.9 % IV BOLUS
1000.0000 mL | Freq: Once | INTRAVENOUS | Status: AC
  Administered 2022-07-16: 1000 mL via INTRAVENOUS

## 2022-07-16 MED ORDER — PROMETHAZINE HCL 25 MG/ML IJ SOLN
INTRAMUSCULAR | Status: AC
  Filled 2022-07-16: qty 1

## 2022-07-16 NOTE — ED Provider Notes (Addendum)
[facility] Provider Note: Amy Dell, MD, FACEP  CSN: [id number] MRN: [id number] ARRIVAL: 07/15/22 at 2341 ROOM: MH11/[facility]11   CHIEF COMPLAINT  Hematuria   HISTORY OF PRESENT ILLNESS  07/16/22 2:57 AM Amy Mathis is a 42 y.o. female with a history of lupus.  Her lupus flares usually manifest as nephritis.  She is here with about a week of right flank pain with associated gross hematuria, nausea and vomiting.  She is already on 50 mg of prednisone daily which she states is "maxed out" for her.  She usually gets treated with IV fluids, Zofran and analgesics when she has a flare.  This is typical of her flares.  She has had frequent ED visits for the same symptoms over the past several months.  A CT stone study on 06/15/2022 showed no acute findings.   Past Medical History:  Diagnosis Date   IBS (irritable bowel syndrome)    Lupus (HCC)     Past Surgical History:  Procedure Laterality Date   ABDOMINAL HYSTERECTOMY     CHOLECYSTECTOMY     OOPHORECTOMY      History reviewed. No pertinent family history.  Social History   Tobacco Use   Smoking status: Never   Smokeless tobacco: Never  Vaping Use   Vaping Use: Never used  Substance Use Topics   Alcohol use: Never   Drug use: Never    Prior to Admission medications   Medication Sig Start Date End Date Taking? Authorizing Provider  promethazine (PHENERGAN) 25 MG tablet Take 1 tablet (25 mg total) by mouth every 6 (six) hours as needed for nausea or vomiting. 07/16/22  Yes Rakeem Colley, MD  cyclobenzaprine (FLEXERIL) 10 MG tablet Take 1 tablet (10 mg total) by mouth 2 (two) times daily as needed for muscle spasms. 06/15/22   Sherian Maroon A, PA  diclofenac Sodium (VOLTAREN) 1 % GEL Apply 4 g topically 4 (four) times daily. Apply to affected areas 4 times daily as needed for pain. 09/24/21   Theadora Rama Scales, PA-C  hydroxychloroquine (PLAQUENIL) 200 MG tablet Take by mouth 2 (two) times daily.     [provider]  methocarbamol (ROBAXIN) 500 MG tablet Take 1 tablet (500 mg total) by mouth 2 (two) times daily. 05/11/22   Jeannie Fend, PA-C  nitrofurantoin, macrocrystal-monohydrate, (MACROBID) 100 MG capsule Take 1 capsule (100 mg total) by mouth 2 (two) times daily. 10/29/21   Pollyann Savoy, MD  oxyCODONE (OXY IR/ROXICODONE) 5 MG immediate release tablet Take 1 tablet (5 mg total) by mouth every 6 (six) hours as needed for severe pain. 07/16/22   Zarius Furr, Jonny Ruiz, MD  predniSONE (DELTASONE) 20 MG tablet Take 2.5 tablets (50 mg total) by mouth daily with breakfast. 07/16/22   Jiro Kiester, MD    Allergies Morphine and Penicillins   REVIEW OF SYSTEMS  Negative except as noted here or in the History of Present Illness.   PHYSICAL EXAMINATION  Initial Vital Signs Blood pressure (!) 135/98, pulse 96, temperature 98.3 F (36.8 C), temperature source Oral, resp. rate 20, height 5\' 11"  (1.803 m), weight (!) 147.9 kg, SpO2 99 %.  Examination General: Well-developed, well-nourished female in no acute distress; appearance consistent with age of record HENT: normocephalic; atraumatic Eyes: Normal appearance Neck: supple Heart: regular rate and rhythm Lungs: clear to auscultation bilaterally Abdomen: soft; nondistended; nontender; bowel sounds present GU: Right CVA tenderness Extremities: No deformity; full range of motion Neurologic: Awake, alert and oriented; motor function intact in  all extremities and symmetric; no facial droop Skin: Warm and dry Psychiatric: Normal mood and affect   RESULTS  Summary of this visit's results, reviewed and interpreted by myself:   EKG Interpretation  Date/Time:    Ventricular Rate:    PR Interval:    QRS Duration:   QT Interval:    QTC Calculation:   R Axis:     Text Interpretation:         Laboratory Studies: Results for orders placed or performed during the hospital encounter of 07/16/22 (from the past 24 hour(s))   Comprehensive metabolic panel     Status: Abnormal   Collection Time: 07/16/22 12:20 AM  Result Value Ref Range   Sodium 137 135 - 145 mmol/L   Potassium 3.5 3.5 - 5.1 mmol/L   Chloride 99 98 - 111 mmol/L   CO2 26 22 - 32 mmol/L   Glucose, Bld 132 (H) 70 - 99 mg/dL   BUN 8 6 - 20 mg/dL   Creatinine, Ser 9.60 0.44 - 1.00 mg/dL   Calcium 9.2 8.9 - 45.4 mg/dL   Total Protein 9.1 (H) 6.5 - 8.1 g/dL   Albumin 3.9 3.5 - 5.0 g/dL   AST 29 15 - 41 U/L   ALT 18 0 - 44 U/L   Alkaline Phosphatase 58 38 - 126 U/L   Total Bilirubin 0.4 0.3 - 1.2 mg/dL   GFR, Estimated >09 >81 mL/min   Anion gap 12 5 - 15  CBC with Differential     Status: Abnormal   Collection Time: 07/16/22 12:20 AM  Result Value Ref Range   WBC 11.0 (H) 4.0 - 10.5 K/uL   RBC 4.92 3.87 - 5.11 MIL/uL   Hemoglobin 14.1 12.0 - 15.0 g/dL   HCT 19.1 47.8 - 29.5 %   MCV 82.1 80.0 - 100.0 fL   MCH 28.7 26.0 - 34.0 pg   MCHC 34.9 30.0 - 36.0 g/dL   RDW 62.1 30.8 - 65.7 %   Platelets 418 (H) 150 - 400 K/uL   nRBC 0.0 0.0 - 0.2 %   Neutrophils Relative % 66 %   Neutro Abs 7.1 1.7 - 7.7 K/uL   Lymphocytes Relative 28 %   Lymphs Abs 3.1 0.7 - 4.0 K/uL   Monocytes Relative 5 %   Monocytes Absolute 0.5 0.1 - 1.0 K/uL   Eosinophils Relative 1 %   Eosinophils Absolute 0.2 0.0 - 0.5 K/uL   Basophils Relative 0 %   Basophils Absolute 0.0 0.0 - 0.1 K/uL   Immature Granulocytes 0 %   Abs Immature Granulocytes 0.04 0.00 - 0.07 K/uL  Lipase, blood     Status: None   Collection Time: 07/16/22 12:20 AM  Result Value Ref Range   Lipase 28 11 - 51 U/L  Urinalysis, Routine w reflex microscopic -Urine, Clean Catch     Status: Abnormal   Collection Time: 07/16/22 12:22 AM  Result Value Ref Range   Color, Urine RED (A) YELLOW   APPearance TURBID (A) CLEAR   Specific Gravity, Urine  1.005 - 1.030    TEST NOT REPORTED DUE TO COLOR INTERFERENCE OF URINE PIGMENT   pH  5.0 - 8.0    TEST NOT REPORTED DUE TO COLOR INTERFERENCE OF URINE  PIGMENT   Glucose, UA (A) NEGATIVE mg/dL    TEST NOT REPORTED DUE TO COLOR INTERFERENCE OF URINE PIGMENT   Hgb urine dipstick (A) NEGATIVE    TEST NOT REPORTED DUE TO COLOR INTERFERENCE OF  URINE PIGMENT   Bilirubin Urine (A) NEGATIVE    TEST NOT REPORTED DUE TO COLOR INTERFERENCE OF URINE PIGMENT   Ketones, ur (A) NEGATIVE mg/dL    TEST NOT REPORTED DUE TO COLOR INTERFERENCE OF URINE PIGMENT   Protein, ur (A) NEGATIVE mg/dL    TEST NOT REPORTED DUE TO COLOR INTERFERENCE OF URINE PIGMENT   Nitrite (A) NEGATIVE    TEST NOT REPORTED DUE TO COLOR INTERFERENCE OF URINE PIGMENT   Leukocytes,Ua (A) NEGATIVE    TEST NOT REPORTED DUE TO COLOR INTERFERENCE OF URINE PIGMENT  Urinalysis, Microscopic (reflex)     Status: Abnormal   Collection Time: 07/16/22 12:22 AM  Result Value Ref Range   RBC / HPF 21-50 0 - 5 RBC/hpf   WBC, UA 6-10 0 - 5 WBC/hpf   Bacteria, UA MANY (A) NONE SEEN   Squamous Epithelial / HPF 0-5 0 - 5 /HPF   Imaging Studies: No results found.  ED COURSE and MDM  Nursing notes, initial and subsequent vitals signs, including pulse oximetry, reviewed and interpreted by myself.  Vitals:   07/15/22 2349 07/15/22 2354 07/16/22 0314 07/16/22 0400  BP:  (!) 135/98 138/78 129/77  Pulse:  96 66 73  Resp:  20 18   Temp:  98.3 F (36.8 C) 98.2 F (36.8 C)   TempSrc:  Oral Oral   SpO2:  99% 98% 97%  Weight: (!) 147.9 kg     Height: 5\' 11"  (1.803 m)      Medications  promethazine (PHENERGAN) 25 mg in sodium chloride 0.9 % 50 mL IVPB (25 mg Intravenous New Bag/Given 07/16/22 0414)  promethazine (PHENERGAN) 25 MG/ML injection (  See Procedure Record 07/16/22 0417)  ondansetron (ZOFRAN) injection 4 mg (4 mg Intravenous Given 07/16/22 0010)  sodium chloride 0.9 % bolus 1,000 mL (0 mLs Intravenous Stopped 07/16/22 0413)  ondansetron (ZOFRAN) injection 4 mg (4 mg Intravenous Given 07/16/22 0308)  fentaNYL (SUBLIMAZE) injection 100 mcg (100 mcg Intravenous Given 07/16/22 0308)   HYDROmorphone (DILAUDID) injection 1 mg (1 mg Intravenous Given 07/16/22 0457)   5:27 AM Patient feeling better, states she is ready to go home.  She is requesting Phenergan as her home Zofran is not adequately treating her nausea.  She would also like a refill of her prednisone as she is almost out and has not been able to get into see her physician.  Also, it is not safe to abruptly stop long-term steroids without appropriately tapering.   PROCEDURES  Procedures   ED DIAGNOSES     ICD-10-CM   1. Gross hematuria  R31.0     2. Right flank pain  R10.9          Lawren Sexson, Jonny Ruiz, MD 07/16/22 0531    Paula Libra, MD 07/16/22 [phone]

## 2022-07-19 LAB — URINE CULTURE: Culture: >=100000 — AB

## 2022-07-20 ENCOUNTER — Telehealth (HOSPITAL_BASED_OUTPATIENT_CLINIC_OR_DEPARTMENT_OTHER): Payer: Self-pay | Admitting: *Deleted

## 2022-07-20 ENCOUNTER — Emergency Department (HOSPITAL_BASED_OUTPATIENT_CLINIC_OR_DEPARTMENT_OTHER)
Admission: EM | Admit: 2022-07-20 | Discharge: 2022-07-20 | Disposition: A | Discharge status: Home / Self Care | Payer: Medicaid Other | Attending: Emergency Medicine | Admitting: Emergency Medicine

## 2022-07-20 ENCOUNTER — Other Ambulatory Visit: Payer: Self-pay

## 2022-07-20 ENCOUNTER — Encounter (HOSPITAL_BASED_OUTPATIENT_CLINIC_OR_DEPARTMENT_OTHER): Payer: Self-pay | Admitting: Emergency Medicine

## 2022-07-20 DIAGNOSIS — N3001 Acute cystitis with hematuria: Secondary | ICD-10-CM

## 2022-07-20 DIAGNOSIS — R310 Gross hematuria: Secondary | ICD-10-CM | POA: Insufficient documentation

## 2022-07-20 DIAGNOSIS — D72829 Elevated white blood cell count, unspecified: Secondary | ICD-10-CM | POA: Insufficient documentation

## 2022-07-20 DIAGNOSIS — R109 Unspecified abdominal pain: Secondary | ICD-10-CM | POA: Insufficient documentation

## 2022-07-20 DIAGNOSIS — R1031 Right lower quadrant pain: Secondary | ICD-10-CM | POA: Diagnosis not present

## 2022-07-20 LAB — CBC WITH DIFFERENTIAL/PLATELET
Abs Immature Granulocytes: 0.09 10*3/uL — ABNORMAL HIGH (ref 0.00–0.07)
Basophils Absolute: 0 10*3/uL (ref 0.0–0.1)
Basophils Relative: 0 %
Eosinophils Absolute: 0 10*3/uL (ref 0.0–0.5)
Eosinophils Relative: 0 %
HCT: 39.2 % (ref 36.0–46.0)
Hemoglobin: 13.9 g/dL (ref 12.0–15.0)
Immature Granulocytes: 1 %
Lymphocytes Relative: 22 %
Lymphs Abs: 4 10*3/uL (ref 0.7–4.0)
MCH: 28.8 pg (ref 26.0–34.0)
MCHC: 35.5 g/dL (ref 30.0–36.0)
MCV: 81.2 fL (ref 80.0–100.0)
Monocytes Absolute: 0.9 10*3/uL (ref 0.1–1.0)
Monocytes Relative: 5 %
Neutro Abs: 13.3 10*3/uL — ABNORMAL HIGH (ref 1.7–7.7)
Neutrophils Relative %: 72 %
Platelets: 434 10*3/uL — ABNORMAL HIGH (ref 150–400)
RBC: 4.83 MIL/uL (ref 3.87–5.11)
RDW: 14.6 % (ref 11.5–15.5)
WBC: 18.3 10*3/uL — ABNORMAL HIGH (ref 4.0–10.5)
nRBC: 0 % (ref 0.0–0.2)

## 2022-07-20 LAB — URINALYSIS, W/ REFLEX TO CULTURE (INFECTION SUSPECTED)
RBC / HPF: >50 RBC/hpf (ref 0–5)
WBC, UA: NONE SEEN WBC/hpf (ref 0–5)

## 2022-07-20 LAB — COMPREHENSIVE METABOLIC PANEL
ALT: 16 U/L (ref 0–44)
AST: 23 U/L (ref 15–41)
Albumin: 4 g/dL (ref 3.5–5.0)
Alkaline Phosphatase: 59 U/L (ref 38–126)
Anion gap: 8 (ref 5–15)
BUN: 12 mg/dL (ref 6–20)
CO2: 27 mmol/L (ref 22–32)
Calcium: 9.1 mg/dL (ref 8.9–10.3)
Chloride: 102 mmol/L (ref 98–111)
Creatinine, Ser: 0.89 mg/dL (ref 0.44–1.00)
GFR, Estimated: >60 mL/min (ref >60)
Glucose, Bld: 100 mg/dL — ABNORMAL HIGH (ref 70–99)
Potassium: 3.9 mmol/L (ref 3.5–5.1)
Sodium: 137 mmol/L (ref 135–145)
Total Bilirubin: 0.6 mg/dL (ref 0.3–1.2)
Total Protein: 9.1 g/dL — ABNORMAL HIGH (ref 6.5–8.1)

## 2022-07-20 MED ORDER — PREDNISONE 50 MG PO TABS
50.0000 mg | ORAL_TABLET | Freq: Once | ORAL | Status: AC
  Administered 2022-07-20: 50 mg via ORAL
  Filled 2022-07-20: qty 1

## 2022-07-20 MED ORDER — PREDNISONE 50 MG PO TABS: 50.0000 mg | ORAL_TABLET | Freq: Every day | ORAL | 0 refills | Status: DC

## 2022-07-20 MED ORDER — SODIUM CHLORIDE 0.9 % IV BOLUS
1000.0000 mL | Freq: Once | INTRAVENOUS | Status: AC
  Administered 2022-07-20: 1000 mL via INTRAVENOUS

## 2022-07-20 MED ORDER — OXYCODONE HCL 5 MG PO TABS: 5.0000 mg | ORAL_TABLET | Freq: Four times a day (QID) | ORAL | 0 refills | Status: DC | PRN

## 2022-07-20 MED ORDER — OXYCODONE HCL 5 MG PO TABS
10.0000 mg | ORAL_TABLET | Freq: Once | ORAL | Status: AC
  Administered 2022-07-20: 10 mg via ORAL
  Filled 2022-07-20: qty 2

## 2022-07-20 MED ORDER — SULFAMETHOXAZOLE-TRIMETHOPRIM 800-160 MG PO TABS
1.0000 | ORAL_TABLET | Freq: Once | ORAL | Status: AC
  Administered 2022-07-20: 1 via ORAL
  Filled 2022-07-20: qty 1

## 2022-07-20 MED ORDER — CIPROFLOXACIN HCL 500 MG PO TABS: 500.0000 mg | ORAL_TABLET | Freq: Two times a day (BID) | ORAL | 0 refills | Status: AC

## 2022-07-20 MED ORDER — HYDROMORPHONE HCL 1 MG/ML IJ SOLN
1.0000 mg | Freq: Once | INTRAMUSCULAR | Status: AC
  Administered 2022-07-20: 1 mg via INTRAVENOUS
  Filled 2022-07-20: qty 1

## 2022-07-20 MED ORDER — CIPROFLOXACIN IN D5W 400 MG/200ML IV SOLN
400.0000 mg | Freq: Once | INTRAVENOUS | Status: AC
  Administered 2022-07-20: 400 mg via INTRAVENOUS
  Filled 2022-07-20: qty 200

## 2022-07-20 MED ORDER — SODIUM CHLORIDE 0.9 % IV SOLN
12.5000 mg | Freq: Four times a day (QID) | INTRAVENOUS | Status: DC | PRN
  Filled 2022-07-20: qty 0.5

## 2022-07-20 NOTE — Progress Notes (Signed)
ED Antimicrobial Stewardship Positive Culture Follow Up   Amy Mathis is an 42 y.o. female who presented to [facility] on 07/16/2022 with a chief complaint of  Chief Complaint  Patient presents with   Hematuria    Recent Results (from the past 720 hour(s))  Urine Culture     Status: Abnormal   Collection Time: 07/16/22  3:05 AM   Specimen: Urine, Clean Catch  Result Value Ref Range Status   Specimen Description   Final    URINE, CLEAN CATCH Performed at [facility], [address] [place], [place] [address]    Special Requests   Final    NONE Performed at [facility], [address] [place], [place] [address]    Culture (A)  Final    >=100,000 COLONIES/mL CITROBACTER FREUNDII 50,000 COLONIES/mL KLEBSIELLA OXYTOCA    Report Status 07/19/2022 FINAL  Final   Organism ID, Bacteria KLEBSIELLA OXYTOCA (A)  Final   Organism ID, Bacteria CITROBACTER FREUNDII (A)  Final      Susceptibility   Citrobacter freundii - MIC*    CEFEPIME <=0.12 SENSITIVE Sensitive     CEFTRIAXONE 8 RESISTANT Resistant     CIPROFLOXACIN <=0.25 SENSITIVE Sensitive     GENTAMICIN <=1 SENSITIVE Sensitive     IMIPENEM <=0.25 SENSITIVE Sensitive     NITROFURANTOIN <=16 SENSITIVE Sensitive     TRIMETH/SULFA <=20 SENSITIVE Sensitive     PIP/TAZO 8 SENSITIVE Sensitive     * >=100,000 COLONIES/mL CITROBACTER FREUNDII   Klebsiella oxytoca - MIC*    AMPICILLIN RESISTANT Resistant     CEFEPIME <=0.12 SENSITIVE Sensitive     CEFTRIAXONE <=0.25 SENSITIVE Sensitive     CIPROFLOXACIN <=0.25 SENSITIVE Sensitive     GENTAMICIN <=1 SENSITIVE Sensitive     IMIPENEM <=0.25 SENSITIVE Sensitive     NITROFURANTOIN <=16 SENSITIVE Sensitive     TRIMETH/SULFA <=20 SENSITIVE Sensitive     AMPICILLIN/SULBACTAM 4 SENSITIVE Sensitive     PIP/TAZO <=4 SENSITIVE Sensitive     * 50,000 COLONIES/mL KLEBSIELLA OXYTOCA    Plan: Call for sx check, if positive for urinary sx give Macrobid  ED Provider:  Barrie Dunker, PA-C   Daylene Posey 07/20/2022, 10:12 AM Clinical Pharmacist Monday - Friday phone -  [phone] Saturday - Sunday phone - [phone]

## 2022-07-20 NOTE — Telephone Encounter (Signed)
Post ED Visit - Positive Culture Follow-up: Unsuccessful Patient Follow-up  Culture assessed and recommendations reviewed by:  [x]  Daylene Posey, Pharm.D. []  Celedonio Miyamoto, Pharm.D., BCPS AQ-ID []  Garvin Fila, Pharm.D., BCPS []  Georgina Pillion, Pharm.D., BCPS []  [place], [place].D., BCPS, AAHIVP []  Estella Husk, Pharm.D., BCPS, AAHIVP []  Sherlynn Carbon, PharmD []  Pollyann Samples, PharmD, BCPS  Positive urine culture  [x]  Patient discharged without antimicrobial prescription and treatment may indicated Call for symptom check. If having urinary symptoms give Macrobid BID 100mg  x 5 days per Barrie Dunker, PA-C   Unable to contact patient , letter will be sent to address on file  Amy Mathis 07/20/2022, 2:58 PM

## 2022-07-20 NOTE — ED Triage Notes (Signed)
Pt c/o continued RT flank pain; sts her urine is "black" now

## 2022-07-20 NOTE — Discharge Instructions (Signed)
As we discussed, you have a urinary tract infection.  I have prescribed Cipro twice daily for a week.  I have also prescribed oxycodone for pain  I have refilled your prednisone as well.  Continue to follow-up with your doctor and try to get referral to rheumatology  Return to ER if you have worse flank pain or fever or vomiting or unable to urinate or darker urine

## 2022-07-20 NOTE — ED Provider Notes (Signed)
[place] [facility] AT [facility] Provider Note   CSN: [id number] Arrival date & time: 07/20/22  1732     History  Chief Complaint  Patient presents with   Flank Pain    Amy Mathis is a 42 y.o. female here presenting with persistent right flank pain.  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  Patient also has recurrent UTIs.  Patient states that she was seen multiple times recently for similar symptoms.  She states that she finally got Medicaid and she is still in the process of seeing rheumatology.  Patient was most recently seen here 5 days ago for hematuria.  Patient's UA turned out positive for Citrobacter and Klebsiella.  She was not given any antibiotics.  Patient states that her urine is even darker and now is black in color.  Patient denies any clots in her urine.  Patient states that she is taking her prednisone 50 mg still.  She had some vomiting that improved with Zofran.  The history is provided by the patient.       Home Medications Prior to Admission medications   Medication Sig Start Date End Date Taking? Authorizing Provider  cyclobenzaprine (FLEXERIL) 10 MG tablet Take 1 tablet (10 mg total) by mouth 2 (two) times daily as needed for muscle spasms. 06/15/22   Sherian Maroon A, PA  diclofenac Sodium (VOLTAREN) 1 % GEL Apply 4 g topically 4 (four) times daily. Apply to affected areas 4 times daily as needed for pain. 09/24/21   Theadora Rama Scales, PA-C  hydroxychloroquine (PLAQUENIL) 200 MG tablet Take by mouth 2 (two) times daily.    [provider]  methocarbamol (ROBAXIN) 500 MG tablet Take 1 tablet (500 mg total) by mouth 2 (two) times daily. 05/11/22   Jeannie Fend, PA-C  nitrofurantoin, macrocrystal-monohydrate, (MACROBID) 100 MG capsule Take 1 capsule (100 mg total) by mouth 2 (two) times daily. 10/29/21   Pollyann Savoy, MD  oxyCODONE (OXY IR/ROXICODONE) 5 MG immediate release tablet Take 1 tablet (5 mg  total) by mouth every 6 (six) hours as needed for severe pain. 07/16/22   Molpus, Jonny Ruiz, MD  predniSONE (DELTASONE) 20 MG tablet Take 2.5 tablets (50 mg total) by mouth daily with breakfast. 07/16/22   Molpus, Jonny Ruiz, MD  promethazine (PHENERGAN) 25 MG tablet Take 1 tablet (25 mg total) by mouth every 6 (six) hours as needed for nausea or vomiting. 07/16/22   Molpus, John, MD      Allergies    Morphine and Penicillins    Review of Systems   Review of Systems  Genitourinary:  Positive for flank pain and hematuria.  All other systems reviewed and are negative.   Physical Exam Updated Vital Signs BP (!) 144/102 (BP Location: Right Arm)   Pulse (!) 104   Temp 97.7 F (36.5 C)   Resp 20   Ht 5\' 11"  (1.803 m)   Wt (!) 147.9 kg   SpO2 97%   BMI 45.47 kg/m  Physical Exam Vitals and nursing note reviewed.  Constitutional:      Comments: Uncomfortable  HENT:     Head: Normocephalic.     Nose: Nose normal.     Mouth/Throat:     Mouth: Mucous membranes are moist.  Eyes:     Extraocular Movements: Extraocular movements intact.     Pupils: Pupils are equal, round, and reactive to light.  Cardiovascular:     Rate and Rhythm: Normal rate and regular rhythm.  Pulses: Normal pulses.     Heart sounds: Normal heart sounds.  Pulmonary:     Effort: Pulmonary effort is normal.     Breath sounds: Normal breath sounds.  Abdominal:     General: Abdomen is flat.     Palpations: Abdomen is soft.     Comments: Mild right CVA tenderness  Musculoskeletal:        General: Normal range of motion.     Cervical back: Normal range of motion and neck supple.  Skin:    General: Skin is warm.     Capillary Refill: Capillary refill takes less than 2 seconds.  Neurological:     General: No focal deficit present.     Mental Status: She is oriented to person, place, and time.  Psychiatric:        Mood and Affect: Mood normal.        Behavior: Behavior normal.     ED Results / Procedures /  Treatments   Labs (all labs ordered are listed, but only abnormal results are displayed) Labs Reviewed  CBC WITH DIFFERENTIAL/PLATELET  COMPREHENSIVE METABOLIC PANEL  URINALYSIS, W/ REFLEX TO CULTURE (INFECTION SUSPECTED)    EKG None  Radiology No results found.  Procedures Procedures    Medications Ordered in ED Medications  promethazine (PHENERGAN) 12.5 mg in sodium chloride 0.9 % 50 mL IVPB (has no administration in time range)  sodium chloride 0.9 % bolus 1,000 mL (1,000 mLs Intravenous New Bag/Given 07/20/22 1855)  sulfamethoxazole-trimethoprim (BACTRIM DS) 800-160 MG per tablet 1 tablet (1 tablet Oral Given 07/20/22 1815)  oxyCODONE (Oxy IR/ROXICODONE) immediate release tablet 10 mg (10 mg Oral Given 07/20/22 1819)    ED Course/ Medical Decision Making/ A&P                             Medical Decision Making Amy Mathis is a 42 y.o. female history of lupus nephritis here presenting with persistent flank pain and dark urine.  Patient states that she is having even darker urine.  She has no suprapubic pain to suggest urinary retention.  I think this is a chronic issue.  Will check CBC and CMP and UA.  Her last urine culture several days ago showed Citrobacter and Klebsiella.  She has multiple drug allergies so we will try Bactrim.  Will give IV fluids and pain medicine and antiemetics and reassess   9:10 PM Patient is able to tolerate p.o.  Patient's white blood cell count is 18 and I am not sure if see because of her steroid use or not.  Her UA showed few bacteria and numerous red blood cell count which is consistent than previous.  Patient received 2 L bolus and states that she has no clots in her urine.  Patient is not in urinary retention.  I reviewed her previous urine culture and it is sensitive to Cipro so I gave her dose of Cipro IV.  At this point will discharge home with Cipro and I refilled her prednisone and also her pain medicine.  Problems Addressed: Flank pain:  chronic illness or injury Gross hematuria: acute illness or injury  Amount and/or Complexity of Data Reviewed Labs: ordered. Decision-making details documented in ED Course.  Risk Prescription drug management.    Final Clinical Impression(s) / ED Diagnoses Final diagnoses:  None    Rx / DC Orders ED Discharge Orders     None  Charlynne Pander, MD 07/20/22 2111

## 2022-08-02 ENCOUNTER — Other Ambulatory Visit: Payer: Self-pay

## 2022-08-02 ENCOUNTER — Emergency Department (HOSPITAL_BASED_OUTPATIENT_CLINIC_OR_DEPARTMENT_OTHER)
Admission: EM | Admit: 2022-08-02 | Discharge: 2022-08-02 | Disposition: A | Discharge status: Home / Self Care | Payer: Medicaid Other | Attending: Emergency Medicine | Admitting: Emergency Medicine

## 2022-08-02 ENCOUNTER — Encounter (HOSPITAL_BASED_OUTPATIENT_CLINIC_OR_DEPARTMENT_OTHER): Payer: Self-pay

## 2022-08-02 DIAGNOSIS — R109 Unspecified abdominal pain: Secondary | ICD-10-CM | POA: Diagnosis not present

## 2022-08-02 DIAGNOSIS — Z79899 Other long term (current) drug therapy: Secondary | ICD-10-CM | POA: Insufficient documentation

## 2022-08-02 DIAGNOSIS — R319 Hematuria, unspecified: Secondary | ICD-10-CM | POA: Insufficient documentation

## 2022-08-02 MED ORDER — HYDROMORPHONE HCL 1 MG/ML IJ SOLN
1.0000 mg | Freq: Once | INTRAMUSCULAR | Status: AC
  Administered 2022-08-02: 1 mg via INTRAVENOUS
  Filled 2022-08-02: qty 1

## 2022-08-02 MED ORDER — SODIUM CHLORIDE 0.9 % IV BOLUS
1000.0000 mL | Freq: Once | INTRAVENOUS | Status: AC
  Administered 2022-08-02: 1000 mL via INTRAVENOUS

## 2022-08-02 MED ORDER — OXYCODONE HCL 5 MG PO TABS: 5.0000 mg | ORAL_TABLET | Freq: Four times a day (QID) | ORAL | 0 refills | Status: DC | PRN

## 2022-08-02 MED ORDER — ONDANSETRON HCL 4 MG/2ML IJ SOLN
4.0000 mg | Freq: Once | INTRAMUSCULAR | Status: AC
  Administered 2022-08-02: 4 mg via INTRAVENOUS
  Filled 2022-08-02: qty 2

## 2022-08-02 NOTE — ED Triage Notes (Signed)
Patient stated she is having a lupus flare and is having flank pain with hematuria for two days.

## 2022-08-02 NOTE — ED Provider Notes (Signed)
[place] [facility] Provider Note   CSN: [id number] Arrival date & time: 08/02/22  1509     History  Chief Complaint  Patient presents with   Hematuria   Flank Pain    Amy Mathis is a 42 y.o. female.  Patient complains of hematuria.  Patient reports that she has lupus and that when she has an exacerbation of her lupus she develops hematuria.  Patient reports she does not currently have a primary care physician.  Patient has been unable to follow-up with urology as they have required that she have a primary care referral.  Patient complains of right flank pain.  Patient has had CTs and ultrasound which showed no evidence of kidney stones.  Patient reports this is the same pain that she has had previously.  Patient states that she normally gets IV fluids and nausea medicine and Dilaudid.  Patient denies any fever she denies any chills patient denies any diarrhea she has not had any constipation  The history is provided by the patient. No language interpreter was used.  Hematuria This is a new problem. The current episode started more than 2 days ago.  Flank Pain       Home Medications Prior to Admission medications   Medication Sig Start Date End Date Taking? Authorizing Provider  oxyCODONE (ROXICODONE) 5 MG immediate release tablet Take 1 tablet (5 mg total) by mouth every 6 (six) hours as needed for severe pain. 08/02/22  Yes Cheron Schaumann K, PA-C  cyclobenzaprine (FLEXERIL) 10 MG tablet Take 1 tablet (10 mg total) by mouth 2 (two) times daily as needed for muscle spasms. 06/15/22   Sherian Maroon A, PA  diclofenac Sodium (VOLTAREN) 1 % GEL Apply 4 g topically 4 (four) times daily. Apply to affected areas 4 times daily as needed for pain. 09/24/21   Theadora Rama Scales, PA-C  hydroxychloroquine (PLAQUENIL) 200 MG tablet Take by mouth 2 (two) times daily.    [provider]  methocarbamol (ROBAXIN) 500 MG tablet Take 1 tablet (500  mg total) by mouth 2 (two) times daily. 05/11/22   Jeannie Fend, PA-C  nitrofurantoin, macrocrystal-monohydrate, (MACROBID) 100 MG capsule Take 1 capsule (100 mg total) by mouth 2 (two) times daily. 10/29/21   Pollyann Savoy, MD  predniSONE (DELTASONE) 50 MG tablet Take 1 tablet (50 mg total) by mouth daily with breakfast. 07/20/22   Charlynne Pander, MD  promethazine (PHENERGAN) 25 MG tablet Take 1 tablet (25 mg total) by mouth every 6 (six) hours as needed for nausea or vomiting. 07/16/22   Molpus, John, MD      Allergies    Morphine and Penicillins    Review of Systems   Review of Systems  Genitourinary:  Positive for flank pain and hematuria.  All other systems reviewed and are negative.   Physical Exam Updated Vital Signs BP (!) 108/97   Pulse 88   Temp 98.4 F (36.9 C) (Oral)   Resp 18   Ht 5\' 11"  (1.803 m)   Wt (!) 147.9 kg   SpO2 95%   BMI 45.47 kg/m  Physical Exam Vitals and nursing note reviewed.  Constitutional:      General: She is not in acute distress.    Appearance: She is well-developed.  HENT:     Head: Normocephalic and atraumatic.  Eyes:     Conjunctiva/sclera: Conjunctivae normal.  Cardiovascular:     Rate and Rhythm: Normal rate and regular rhythm.  Heart sounds: No murmur heard. Pulmonary:     Effort: Pulmonary effort is normal. No respiratory distress.     Breath sounds: Normal breath sounds.  Abdominal:     Palpations: Abdomen is soft.     Tenderness: There is no abdominal tenderness.  Musculoskeletal:        General: No swelling.     Cervical back: Neck supple.  Skin:    General: Skin is warm and dry.     Capillary Refill: Capillary refill takes less than 2 seconds.  Neurological:     Mental Status: She is alert.  Psychiatric:        Mood and Affect: Mood normal.     ED Results / Procedures / Treatments   Labs (all labs ordered are listed, but only abnormal results are displayed) Labs Reviewed - No data to  display  EKG None  Radiology No results found.  Procedures Procedures    Medications Ordered in ED Medications  sodium chloride 0.9 % bolus 1,000 mL (0 mLs Intravenous Stopped 08/02/22 1813)  HYDROmorphone (DILAUDID) injection 1 mg (1 mg Intravenous Given 08/02/22 1707)  ondansetron (ZOFRAN) injection 4 mg (4 mg Intravenous Given 08/02/22 1707)    ED Course/ Medical Decision Making/ A&P                             Medical Decision Making Patient complains of blood in her urine and right flank pain.  Patient has had multiple evaluations for similar.  Patient is not having any UTI symptoms.  Patient is requesting IV fluids and pain management  Amount and/or Complexity of Data Reviewed External Data Reviewed: notes.    Details: ED notes reviewed  Risk Prescription drug management. Risk Details: Given IV fluids Dilaudid and Zofran.  Patient is given a prescription for 10 oxycodone tablets.  I discussed with patient the fact that she does need to establish with a primary care physician as the emergency department is not the appropriate place for chronic pain management.           Final Clinical Impression(s) / ED Diagnoses Final diagnoses:  Hematuria, unspecified type    Rx / DC Orders ED Discharge Orders          Ordered    oxyCODONE (ROXICODONE) 5 MG immediate release tablet  Every 6 hours PRN        08/02/22 1913           An After Visit Summary was printed and given to the patient.    Osie Cheeks 08/02/22 2017    Benjiman Core, MD 08/02/22 [phone]

## 2022-08-15 ENCOUNTER — Emergency Department (HOSPITAL_BASED_OUTPATIENT_CLINIC_OR_DEPARTMENT_OTHER)
Admission: EM | Admit: 2022-08-15 | Discharge: 2022-08-16 | Disposition: A | Discharge status: Home / Self Care | Payer: Medicaid Other | Attending: Emergency Medicine | Admitting: Emergency Medicine

## 2022-08-15 ENCOUNTER — Encounter (HOSPITAL_BASED_OUTPATIENT_CLINIC_OR_DEPARTMENT_OTHER): Payer: Self-pay

## 2022-08-15 ENCOUNTER — Other Ambulatory Visit: Payer: Self-pay

## 2022-08-15 DIAGNOSIS — M329 Systemic lupus erythematosus, unspecified: Secondary | ICD-10-CM | POA: Diagnosis not present

## 2022-08-15 DIAGNOSIS — R1011 Right upper quadrant pain: Secondary | ICD-10-CM | POA: Diagnosis not present

## 2022-08-15 DIAGNOSIS — R310 Gross hematuria: Secondary | ICD-10-CM | POA: Diagnosis not present

## 2022-08-15 DIAGNOSIS — R109 Unspecified abdominal pain: Secondary | ICD-10-CM

## 2022-08-15 MED ORDER — ONDANSETRON HCL 4 MG/2ML IJ SOLN
4.0000 mg | Freq: Once | INTRAMUSCULAR | Status: AC
  Administered 2022-08-16: 4 mg via INTRAVENOUS
  Filled 2022-08-15: qty 2

## 2022-08-15 MED ORDER — SODIUM CHLORIDE 0.9 % IV SOLN
25.0000 mg | Freq: Once | INTRAVENOUS | Status: DC | PRN
  Filled 2022-08-15: qty 1

## 2022-08-15 MED ORDER — SODIUM CHLORIDE 0.9 % IV BOLUS
1000.0000 mL | Freq: Once | INTRAVENOUS | Status: AC
  Administered 2022-08-16: 1000 mL via INTRAVENOUS

## 2022-08-15 MED ORDER — FENTANYL CITRATE PF 50 MCG/ML IJ SOSY
100.0000 ug | PREFILLED_SYRINGE | Freq: Once | INTRAMUSCULAR | Status: AC
  Administered 2022-08-16: 100 ug via INTRAVENOUS
  Filled 2022-08-15: qty 2

## 2022-08-15 NOTE — ED Provider Notes (Signed)
[facility] Provider Note: Amy Dell, MD, FACEP  CSN: [id number] MRN: [id number] ARRIVAL: 08/15/22 at 2342 ROOM: [facility]07/[facility]07   CHIEF COMPLAINT  Flank Pain   HISTORY OF PRESENT ILLNESS  08/15/22 11:53 PM Amy Mathis is a 42 y.o. female with a history of lupus.  She is here with right kidney and flank pain radiating to her abdomen along with gross hematuria.  This is her typical presentation when she has a lupus flare, although she does have a history of PCOS and endometriosis status post hysterectomy/oophorectomy so she states it is possible this represents remnants of endometriosis.  She was already on prednisone 50 mg daily.  When she has one of these flares she usually gets treated with IV fluids, antiemetics and analgesics.  A CT stone study on 06/15/2022 showed no acute findings.  On her last visit she did have some pyuria and urine culture grew out greater than 100,000 colonies of Citrobacter freundii and 50,000 colonies of Klebsiella oxytocin.  The Klebsiella was pansensitive for tested antibiotics except ampicillin.  The Citrobacter was pansensitive for tested antibiotics except ceftriaxone.   Past Medical History:  Diagnosis Date   IBS (irritable bowel syndrome)    Lupus (HCC)     Past Surgical History:  Procedure Laterality Date   ABDOMINAL HYSTERECTOMY     CHOLECYSTECTOMY     OOPHORECTOMY      History reviewed. No pertinent family history.  Social History   Tobacco Use   Smoking status: Never   Smokeless tobacco: Never  Vaping Use   Vaping Use: Never used  Substance Use Topics   Alcohol use: Never   Drug use: Never    Prior to Admission medications   Medication Sig Start Date End Date Taking? Authorizing Provider  ciprofloxacin (CIPRO) 500 MG tablet Take 1 tablet (500 mg total) by mouth 2 (two) times daily. One po bid x 7 days 08/16/22  Yes Alice Vitelli, MD  fluconazole (DIFLUCAN) 150 MG tablet Take 1 tablet as needed for vaginal yeast  infection.  May repeat in 3 days if symptoms persist. 08/16/22  Yes Jadarian Mckay, Jonny Ruiz, MD  cyclobenzaprine (FLEXERIL) 10 MG tablet Take 1 tablet (10 mg total) by mouth 2 (two) times daily as needed for muscle spasms. 06/15/22   Sherian Maroon A, PA  diclofenac Sodium (VOLTAREN) 1 % GEL Apply 4 g topically 4 (four) times daily. Apply to affected areas 4 times daily as needed for pain. 09/24/21   Theadora Rama Scales, PA-C  hydroxychloroquine (PLAQUENIL) 200 MG tablet Take by mouth 2 (two) times daily.    [provider]  methocarbamol (ROBAXIN) 500 MG tablet Take 1 tablet (500 mg total) by mouth 2 (two) times daily. 05/11/22   Jeannie Fend, PA-C  oxyCODONE (ROXICODONE) 5 MG immediate release tablet Take 1 tablet (5 mg total) by mouth every 4 (four) hours as needed for severe pain. 08/16/22   Bao Coreas, MD  predniSONE (DELTASONE) 50 MG tablet Take 1 tablet (50 mg total) by mouth daily with breakfast. 07/20/22   Charlynne Pander, MD  promethazine (PHENERGAN) 25 MG tablet Take 1 tablet (25 mg total) by mouth every 6 (six) hours as needed for nausea or vomiting. 07/16/22   Samit Sylve, MD    Allergies Morphine and Penicillins   REVIEW OF SYSTEMS  Negative except as noted here or in the History of Present Illness.   PHYSICAL EXAMINATION  Initial Vital Signs Blood pressure (!) 106/58, pulse 100, temperature 98.4 F (36.9  C), temperature source Oral, resp. rate 20, height 5\' 11"  (1.803 m), weight (!) 147.9 kg, SpO2 98 %.  Examination General: Well-developed, well-nourished female in no acute distress; appearance consistent with age of record HENT: normocephalic; atraumatic Eyes: Normal appearance Neck: supple Heart: regular rate and rhythm Lungs: clear to auscultation bilaterally Abdomen: soft; nondistended; right upper quadrant tenderness; bowel sounds present GU: Right CVA tenderness Extremities: No deformity; full range of motion Neurologic: Awake, alert and oriented; motor  function intact in all extremities and symmetric; no facial droop Skin: Warm and dry Psychiatric: Normal mood and affect   RESULTS  Summary of this visit's results, reviewed and interpreted by myself:   EKG Interpretation  Date/Time:    Ventricular Rate:    PR Interval:    QRS Duration:   QT Interval:    QTC Calculation:   R Axis:     Text Interpretation:         Laboratory Studies: Results for orders placed or performed during the hospital encounter of 08/15/22 (from the past 24 hour(s))  CBC with Differential     Status: Abnormal   Collection Time: 08/16/22  1:30 AM  Result Value Ref Range   WBC 10.8 (H) 4.0 - 10.5 K/uL   RBC 4.62 3.87 - 5.11 MIL/uL   Hemoglobin 13.4 12.0 - 15.0 g/dL   HCT 16.1 09.6 - 04.5 %   MCV 81.6 80.0 - 100.0 fL   MCH 29.0 26.0 - 34.0 pg   MCHC 35.5 30.0 - 36.0 g/dL   RDW 40.9 81.1 - 91.4 %   Platelets 427 (H) 150 - 400 K/uL   nRBC 0.0 0.0 - 0.2 %   Neutrophils Relative % 54 %   Neutro Abs 5.9 1.7 - 7.7 K/uL   Lymphocytes Relative 37 %   Lymphs Abs 4.0 0.7 - 4.0 K/uL   Monocytes Relative 6 %   Monocytes Absolute 0.6 0.1 - 1.0 K/uL   Eosinophils Relative 2 %   Eosinophils Absolute 0.2 0.0 - 0.5 K/uL   Basophils Relative 1 %   Basophils Absolute 0.1 0.0 - 0.1 K/uL   Immature Granulocytes 0 %   Abs Immature Granulocytes 0.03 0.00 - 0.07 K/uL  Comprehensive metabolic panel     Status: Abnormal   Collection Time: 08/16/22  1:30 AM  Result Value Ref Range   Sodium 138 135 - 145 mmol/L   Potassium 3.9 3.5 - 5.1 mmol/L   Chloride 103 98 - 111 mmol/L   CO2 27 22 - 32 mmol/L   Glucose, Bld 88 70 - 99 mg/dL   BUN 11 6 - 20 mg/dL   Creatinine, Ser 7.82 0.44 - 1.00 mg/dL   Calcium 9.0 8.9 - 95.6 mg/dL   Total Protein 8.3 (H) 6.5 - 8.1 g/dL   Albumin 3.8 3.5 - 5.0 g/dL   AST 17 15 - 41 U/L   ALT 15 0 - 44 U/L   Alkaline Phosphatase 53 38 - 126 U/L   Total Bilirubin 0.5 0.3 - 1.2 mg/dL   GFR, Estimated >21 >30 mL/min   Anion gap 8 5 - 15   Urinalysis, Routine w reflex microscopic -Urine, Clean Catch     Status: Abnormal   Collection Time: 08/16/22  3:04 AM  Result Value Ref Range   Color, Urine RED (A) YELLOW   APPearance TURBID (A) CLEAR   Specific Gravity, Urine  1.005 - 1.030    TEST NOT REPORTED DUE TO COLOR INTERFERENCE OF URINE PIGMENT  pH  5.0 - 8.0    TEST NOT REPORTED DUE TO COLOR INTERFERENCE OF URINE PIGMENT   Glucose, UA (A) NEGATIVE mg/dL    TEST NOT REPORTED DUE TO COLOR INTERFERENCE OF URINE PIGMENT   Hgb urine dipstick (A) NEGATIVE    TEST NOT REPORTED DUE TO COLOR INTERFERENCE OF URINE PIGMENT   Bilirubin Urine (A) NEGATIVE    TEST NOT REPORTED DUE TO COLOR INTERFERENCE OF URINE PIGMENT   Ketones, ur (A) NEGATIVE mg/dL    TEST NOT REPORTED DUE TO COLOR INTERFERENCE OF URINE PIGMENT   Protein, ur (A) NEGATIVE mg/dL    TEST NOT REPORTED DUE TO COLOR INTERFERENCE OF URINE PIGMENT   Nitrite (A) NEGATIVE    TEST NOT REPORTED DUE TO COLOR INTERFERENCE OF URINE PIGMENT   Leukocytes,Ua (A) NEGATIVE    TEST NOT REPORTED DUE TO COLOR INTERFERENCE OF URINE PIGMENT  Urinalysis, Microscopic (reflex)     Status: Abnormal   Collection Time: 08/16/22  3:04 AM  Result Value Ref Range   RBC / HPF >50 0 - 5 RBC/hpf   WBC, UA >50 0 - 5 WBC/hpf   Bacteria, UA FEW (A) NONE SEEN   Squamous Epithelial / HPF 0-5 0 - 5 /HPF   Hyaline Casts, UA PRESENT    Imaging Studies: No results found.  ED COURSE and MDM  Nursing notes, initial and subsequent vitals signs, including pulse oximetry, reviewed and interpreted by myself.  Vitals:   08/15/22 2348 08/16/22 0230 08/16/22 0310 08/16/22 0330  BP: (!) 106/58 110/63 129/73 (!) 109/50  Pulse: 100 99 91 83  Resp: 20 20 18 16   Temp: 98.4 F (36.9 C) 98.3 F (36.8 C)    TempSrc: Oral Oral    SpO2: 98% 97% 99% 98%  Weight:      Height:       Medications  promethazine (PHENERGAN) 25 mg in sodium chloride 0.9 % 50 mL IVPB (has no administration in time range)   ciprofloxacin (CIPRO) IVPB 400 mg (400 mg Intravenous New Bag/Given 08/16/22 0420)  sodium chloride 0.9 % bolus 1,000 mL (has no administration in time range)  HYDROmorphone (DILAUDID) injection 0.5 mg (has no administration in time range)  sodium chloride 0.9 % bolus 1,000 mL (0 mLs Intravenous Stopped 08/16/22 0300)  ondansetron (ZOFRAN) injection 4 mg (4 mg Intravenous Given 08/16/22 0138)  fentaNYL (SUBLIMAZE) injection 100 mcg (100 mcg Intravenous Given 08/16/22 0139)  sodium chloride 0.9 % bolus 1,000 mL (0 mLs Intravenous Stopped 08/16/22 0415)  HYDROmorphone (DILAUDID) injection 1 mg (1 mg Intravenous Given 08/16/22 0308)   4:05 AM The patient's urinalysis shows significant hematuria as well as pyuria.  The pyuria could be due to gross hematuria with associated normal white blood cells, but given that the patient's urine grew out two pathogens in significant numbers when her urine RBC count was greater than 50 and her white blood cell count was only 6-10 makes me concerned she may be having another urinary tract infection.  We will treat her with ciprofloxacin for possible pyelonephritis as the previous culture showed resistance to cephalosporins.  4:23 AM The patient is not currently established with a nephrologist.  We will refer her to [facility].  She recently got health insurance and is reestablishing with a primary care physician but has not seen them yet.   PROCEDURES  Procedures   ED DIAGNOSES     ICD-10-CM   1. Right flank pain  R10.9     2. Michaell Cowing  hematuria  R31.0     3. Lupus (HCC)  M32.9          Kyoko Elsea, Jonny Ruiz, MD 08/16/22 [phone]

## 2022-08-15 NOTE — ED Triage Notes (Signed)
Pt states that she is having a lupus flare and is having R kidney/flank pain radiating to abdomen along with hematuria x1 day.

## 2022-08-16 LAB — URINALYSIS, MICROSCOPIC (REFLEX)
RBC / HPF: >50 RBC/hpf (ref 0–5)
WBC, UA: >50 WBC/hpf (ref 0–5)

## 2022-08-16 LAB — CBC WITH DIFFERENTIAL/PLATELET
Abs Immature Granulocytes: 0.03 10*3/uL (ref 0.00–0.07)
Basophils Absolute: 0.1 10*3/uL (ref 0.0–0.1)
Basophils Relative: 1 %
Eosinophils Absolute: 0.2 10*3/uL (ref 0.0–0.5)
Eosinophils Relative: 2 %
HCT: 37.7 % (ref 36.0–46.0)
Hemoglobin: 13.4 g/dL (ref 12.0–15.0)
Immature Granulocytes: 0 %
Lymphocytes Relative: 37 %
Lymphs Abs: 4 10*3/uL (ref 0.7–4.0)
MCH: 29 pg (ref 26.0–34.0)
MCHC: 35.5 g/dL (ref 30.0–36.0)
MCV: 81.6 fL (ref 80.0–100.0)
Monocytes Absolute: 0.6 10*3/uL (ref 0.1–1.0)
Monocytes Relative: 6 %
Neutro Abs: 5.9 10*3/uL (ref 1.7–7.7)
Neutrophils Relative %: 54 %
Platelets: 427 10*3/uL — ABNORMAL HIGH (ref 150–400)
RBC: 4.62 MIL/uL (ref 3.87–5.11)
RDW: 15 % (ref 11.5–15.5)
WBC: 10.8 10*3/uL — ABNORMAL HIGH (ref 4.0–10.5)
nRBC: 0 % (ref 0.0–0.2)

## 2022-08-16 LAB — COMPREHENSIVE METABOLIC PANEL
ALT: 15 U/L (ref 0–44)
AST: 17 U/L (ref 15–41)
Albumin: 3.8 g/dL (ref 3.5–5.0)
Alkaline Phosphatase: 53 U/L (ref 38–126)
Anion gap: 8 (ref 5–15)
BUN: 11 mg/dL (ref 6–20)
CO2: 27 mmol/L (ref 22–32)
Calcium: 9 mg/dL (ref 8.9–10.3)
Chloride: 103 mmol/L (ref 98–111)
Creatinine, Ser: 0.77 mg/dL (ref 0.44–1.00)
GFR, Estimated: >60 mL/min (ref >60)
Glucose, Bld: 88 mg/dL (ref 70–99)
Potassium: 3.9 mmol/L (ref 3.5–5.1)
Sodium: 138 mmol/L (ref 135–145)
Total Bilirubin: 0.5 mg/dL (ref 0.3–1.2)
Total Protein: 8.3 g/dL — ABNORMAL HIGH (ref 6.5–8.1)

## 2022-08-16 LAB — URINALYSIS, ROUTINE W REFLEX MICROSCOPIC

## 2022-08-16 MED ORDER — CIPROFLOXACIN HCL 500 MG PO TABS: 500.0000 mg | ORAL_TABLET | Freq: Two times a day (BID) | ORAL | 0 refills | Status: DC

## 2022-08-16 MED ORDER — HYDROMORPHONE HCL 1 MG/ML IJ SOLN
1.0000 mg | Freq: Once | INTRAMUSCULAR | Status: AC
  Administered 2022-08-16: 1 mg via INTRAVENOUS
  Filled 2022-08-16: qty 1

## 2022-08-16 MED ORDER — FLUCONAZOLE 150 MG PO TABS: ORAL_TABLET | ORAL | 0 refills | Status: DC

## 2022-08-16 MED ORDER — OXYCODONE HCL 5 MG PO TABS: 5.0000 mg | ORAL_TABLET | ORAL | 0 refills | Status: DC | PRN

## 2022-08-16 MED ORDER — CIPROFLOXACIN IN D5W 400 MG/200ML IV SOLN
400.0000 mg | Freq: Once | INTRAVENOUS | Status: AC
  Administered 2022-08-16: 400 mg via INTRAVENOUS
  Filled 2022-08-16: qty 200

## 2022-08-16 MED ORDER — SODIUM CHLORIDE 0.9 % IV BOLUS
1000.0000 mL | Freq: Once | INTRAVENOUS | Status: AC
  Administered 2022-08-16: 1000 mL via INTRAVENOUS

## 2022-08-16 MED ORDER — HYDROMORPHONE HCL 1 MG/ML IJ SOLN
0.5000 mg | Freq: Once | INTRAMUSCULAR | Status: AC
  Administered 2022-08-16: 0.5 mg via INTRAVENOUS
  Filled 2022-08-16: qty 1

## 2022-08-17 LAB — URINE CULTURE: Culture: NO GROWTH

## 2022-08-22 ENCOUNTER — Other Ambulatory Visit: Payer: Self-pay

## 2022-08-22 DIAGNOSIS — R000 Tachycardia, unspecified: Secondary | ICD-10-CM | POA: Diagnosis not present

## 2022-08-22 DIAGNOSIS — R109 Unspecified abdominal pain: Secondary | ICD-10-CM | POA: Diagnosis not present

## 2022-08-23 ENCOUNTER — Encounter (HOSPITAL_BASED_OUTPATIENT_CLINIC_OR_DEPARTMENT_OTHER): Payer: Self-pay | Admitting: Emergency Medicine

## 2022-08-23 ENCOUNTER — Emergency Department (HOSPITAL_BASED_OUTPATIENT_CLINIC_OR_DEPARTMENT_OTHER): Payer: Medicaid Other

## 2022-08-23 ENCOUNTER — Emergency Department (HOSPITAL_BASED_OUTPATIENT_CLINIC_OR_DEPARTMENT_OTHER)
Admission: EM | Admit: 2022-08-23 | Discharge: 2022-08-23 | Disposition: A | Discharge status: Home / Self Care | Payer: Medicaid Other | Attending: Emergency Medicine | Admitting: Emergency Medicine

## 2022-08-23 DIAGNOSIS — R109 Unspecified abdominal pain: Secondary | ICD-10-CM

## 2022-08-23 LAB — CBC WITH DIFFERENTIAL/PLATELET
Abs Immature Granulocytes: 0.12 10*3/uL — ABNORMAL HIGH (ref 0.00–0.07)
Basophils Absolute: 0 10*3/uL (ref 0.0–0.1)
Basophils Relative: 0 %
Eosinophils Absolute: 0 10*3/uL (ref 0.0–0.5)
Eosinophils Relative: 0 %
HCT: 37.5 % (ref 36.0–46.0)
Hemoglobin: 13.3 g/dL (ref 12.0–15.0)
Immature Granulocytes: 1 %
Lymphocytes Relative: 11 %
Lymphs Abs: 2.2 10*3/uL (ref 0.7–4.0)
MCH: 28.7 pg (ref 26.0–34.0)
MCHC: 35.5 g/dL (ref 30.0–36.0)
MCV: 80.8 fL (ref 80.0–100.0)
Monocytes Absolute: 0.4 10*3/uL (ref 0.1–1.0)
Monocytes Relative: 2 %
Neutro Abs: 17.5 10*3/uL — ABNORMAL HIGH (ref 1.7–7.7)
Neutrophils Relative %: 86 %
Platelets: 442 10*3/uL — ABNORMAL HIGH (ref 150–400)
RBC: 4.64 MIL/uL (ref 3.87–5.11)
RDW: 14.7 % (ref 11.5–15.5)
WBC: 20.2 10*3/uL — ABNORMAL HIGH (ref 4.0–10.5)
nRBC: 0 % (ref 0.0–0.2)

## 2022-08-23 LAB — COMPREHENSIVE METABOLIC PANEL
ALT: 15 U/L (ref 0–44)
AST: 22 U/L (ref 15–41)
Albumin: 4 g/dL (ref 3.5–5.0)
Alkaline Phosphatase: 56 U/L (ref 38–126)
Anion gap: 7 (ref 5–15)
BUN: 13 mg/dL (ref 6–20)
CO2: 23 mmol/L (ref 22–32)
Calcium: 9.2 mg/dL (ref 8.9–10.3)
Chloride: 106 mmol/L (ref 98–111)
Creatinine, Ser: 0.89 mg/dL (ref 0.44–1.00)
GFR, Estimated: >60 mL/min (ref >60)
Glucose, Bld: 139 mg/dL — ABNORMAL HIGH (ref 70–99)
Potassium: 4 mmol/L (ref 3.5–5.1)
Sodium: 136 mmol/L (ref 135–145)
Total Bilirubin: <0.1 mg/dL — ABNORMAL LOW (ref 0.3–1.2)
Total Protein: 8.9 g/dL — ABNORMAL HIGH (ref 6.5–8.1)

## 2022-08-23 MED ORDER — KETOROLAC TROMETHAMINE 30 MG/ML IJ SOLN
30.0000 mg | Freq: Once | INTRAMUSCULAR | Status: AC
  Administered 2022-08-23: 30 mg via INTRAVENOUS
  Filled 2022-08-23: qty 1

## 2022-08-23 MED ORDER — DIPHENHYDRAMINE HCL 50 MG/ML IJ SOLN
12.5000 mg | Freq: Once | INTRAMUSCULAR | Status: AC
  Administered 2022-08-23: 12.5 mg via INTRAVENOUS
  Filled 2022-08-23: qty 1

## 2022-08-23 MED ORDER — KETAMINE HCL 10 MG/ML IJ SOLN
20.0000 mg | Freq: Once | INTRAMUSCULAR | Status: AC
  Administered 2022-08-23: 20 mg via INTRAVENOUS
  Filled 2022-08-23: qty 1

## 2022-08-23 MED ORDER — FENTANYL CITRATE PF 50 MCG/ML IJ SOSY
100.0000 ug | PREFILLED_SYRINGE | Freq: Once | INTRAMUSCULAR | Status: AC
  Administered 2022-08-23: 100 ug via INTRAVENOUS
  Filled 2022-08-23: qty 2

## 2022-08-23 MED ORDER — IOHEXOL 300 MG/ML  SOLN
125.0000 mL | Freq: Once | INTRAMUSCULAR | Status: AC | PRN
  Administered 2022-08-23: 125 mL via INTRAVENOUS

## 2022-08-23 MED ORDER — OXYCODONE HCL 5 MG PO TABS
10.0000 mg | ORAL_TABLET | ORAL | Status: AC
  Administered 2022-08-23: 10 mg via ORAL
  Filled 2022-08-23: qty 2

## 2022-08-23 MED ORDER — DROPERIDOL 2.5 MG/ML IJ SOLN
1.2500 mg | Freq: Once | INTRAMUSCULAR | Status: DC
  Filled 2022-08-23: qty 2

## 2022-08-23 NOTE — ED Triage Notes (Signed)
Patient arrived via POV c/o lupus flare w/ right flank and abdominal pain x 1 week. Patient seen previously for same. Patient tearful in triage. Patient is AO x 4, VS w/ elevated BP and RR, slow gait.

## 2022-08-23 NOTE — ED Provider Notes (Signed)
[facility] [facility] Provider Note   CSN: [id number] Arrival date & time: 08/22/22  2357     History  Chief Complaint  Patient presents with   Abdominal Pain    Amy Mathis is a 42 y.o. female.  The history is provided by the patient.  Abdominal Pain Pain location:  R flank Pain radiates to:  Does not radiate Pain severity:  Severe Onset quality:  Sudden Duration:  1 week Timing:  Constant Progression:  Unchanged Chronicity:  Chronic Relieved by:  Nothing Worsened by:  Nothing Ineffective treatments:  None tried Associated symptoms: hematuria   Associated symptoms: no shortness of breath and no vaginal discharge   Risk factors: no alcohol abuse and not pregnant   Patient with IBS lupus and chronic flank pain presents with worsening symptoms x 1 week not responsive to home medications.  Is currently on high dose steroids.      Past Medical History:  Diagnosis Date   IBS (irritable bowel syndrome)    Lupus (HCC)      Home Medications Prior to Admission medications   Medication Sig Start Date End Date Taking? Authorizing Provider  ciprofloxacin (CIPRO) 500 MG tablet Take 1 tablet (500 mg total) by mouth 2 (two) times daily. One po bid x 7 days 08/16/22   Molpus, John, MD  cyclobenzaprine (FLEXERIL) 10 MG tablet Take 1 tablet (10 mg total) by mouth 2 (two) times daily as needed for muscle spasms. 06/15/22   Sherian Maroon A, PA  diclofenac Sodium (VOLTAREN) 1 % GEL Apply 4 g topically 4 (four) times daily. Apply to affected areas 4 times daily as needed for pain. 09/24/21   Theadora Rama Scales, PA-C  fluconazole (DIFLUCAN) 150 MG tablet Take 1 tablet as needed for vaginal yeast infection.  May repeat in 3 days if symptoms persist. 08/16/22   Molpus, Jonny Ruiz, MD  hydroxychloroquine (PLAQUENIL) 200 MG tablet Take by mouth 2 (two) times daily.    [provider]  methocarbamol (ROBAXIN) 500 MG tablet Take 1 tablet (500 mg total)  by mouth 2 (two) times daily. 05/11/22   Jeannie Fend, PA-C  oxyCODONE (ROXICODONE) 5 MG immediate release tablet Take 1 tablet (5 mg total) by mouth every 4 (four) hours as needed for severe pain. 08/16/22   Molpus, John, MD  predniSONE (DELTASONE) 50 MG tablet Take 1 tablet (50 mg total) by mouth daily with breakfast. 07/20/22   Charlynne Pander, MD  promethazine (PHENERGAN) 25 MG tablet Take 1 tablet (25 mg total) by mouth every 6 (six) hours as needed for nausea or vomiting. 07/16/22   Molpus, John, MD      Allergies    Morphine and Penicillins    Review of Systems   Review of Systems  Respiratory:  Negative for shortness of breath.   Gastrointestinal:  Positive for abdominal pain.  Genitourinary:  Positive for hematuria. Negative for vaginal discharge.  All other systems reviewed and are negative.   Physical Exam Updated Vital Signs BP 122/81   Pulse 93   Temp 99 F (37.2 C) (Oral)   Resp 18   Ht 5\' 11"  (1.803 m)   Wt (!) 147 kg   SpO2 100%   BMI 45.20 kg/m  Physical Exam Vitals and nursing note reviewed.  Constitutional:      General: She is not in acute distress.    Appearance: Normal appearance. She is well-developed.  HENT:     Head: Normocephalic and atraumatic.  Nose: Nose normal.  Eyes:     Pupils: Pupils are equal, round, and reactive to light.  Cardiovascular:     Rate and Rhythm: Normal rate and regular rhythm.     Pulses: Normal pulses.     Heart sounds: Normal heart sounds.  Pulmonary:     Effort: Pulmonary effort is normal. No respiratory distress.     Breath sounds: Normal breath sounds.  Abdominal:     General: Bowel sounds are normal. There is no distension.     Palpations: Abdomen is soft.     Tenderness: There is no abdominal tenderness. There is no guarding or rebound.  Genitourinary:    Vagina: No vaginal discharge.  Musculoskeletal:        General: Normal range of motion.     Cervical back: Normal range of motion and neck supple.   Skin:    General: Skin is warm and dry.     Capillary Refill: Capillary refill takes less than 2 seconds.     Findings: No erythema or rash.  Neurological:     General: No focal deficit present.     Mental Status: She is alert and oriented to person, place, and time.     Deep Tendon Reflexes: Reflexes normal.  Psychiatric:        Mood and Affect: Mood normal.     ED Results / Procedures / Treatments   Labs (all labs ordered are listed, but only abnormal results are displayed) Results for orders placed or performed during the hospital encounter of 08/23/22  CBC with Differential  Result Value Ref Range   WBC 20.2 (H) 4.0 - 10.5 K/uL   RBC 4.64 3.87 - 5.11 MIL/uL   Hemoglobin 13.3 12.0 - 15.0 g/dL   HCT 16.1 09.6 - 04.5 %   MCV 80.8 80.0 - 100.0 fL   MCH 28.7 26.0 - 34.0 pg   MCHC 35.5 30.0 - 36.0 g/dL   RDW 40.9 81.1 - 91.4 %   Platelets 442 (H) 150 - 400 K/uL   nRBC 0.0 0.0 - 0.2 %   Neutrophils Relative % 86 %   Neutro Abs 17.5 (H) 1.7 - 7.7 K/uL   Lymphocytes Relative 11 %   Lymphs Abs 2.2 0.7 - 4.0 K/uL   Monocytes Relative 2 %   Monocytes Absolute 0.4 0.1 - 1.0 K/uL   Eosinophils Relative 0 %   Eosinophils Absolute 0.0 0.0 - 0.5 K/uL   Basophils Relative 0 %   Basophils Absolute 0.0 0.0 - 0.1 K/uL   Immature Granulocytes 1 %   Abs Immature Granulocytes 0.12 (H) 0.00 - 0.07 K/uL  Comprehensive metabolic panel  Result Value Ref Range   Sodium 136 135 - 145 mmol/L   Potassium 4.0 3.5 - 5.1 mmol/L   Chloride 106 98 - 111 mmol/L   CO2 23 22 - 32 mmol/L   Glucose, Bld 139 (H) 70 - 99 mg/dL   BUN 13 6 - 20 mg/dL   Creatinine, Ser 7.82 0.44 - 1.00 mg/dL   Calcium 9.2 8.9 - 95.6 mg/dL   Total Protein 8.9 (H) 6.5 - 8.1 g/dL   Albumin 4.0 3.5 - 5.0 g/dL   AST 22 15 - 41 U/L   ALT 15 0 - 44 U/L   Alkaline Phosphatase 56 38 - 126 U/L   Total Bilirubin <0.1 (L) 0.3 - 1.2 mg/dL   GFR, Estimated >21 >30 mL/min   Anion gap 7 5 - 15   CT ABDOMEN PELVIS  W  CONTRAST  Result Date: 08/23/2022 CLINICAL DATA:  Right-sided flank pain for 1 week EXAM: CT ABDOMEN AND PELVIS WITH CONTRAST TECHNIQUE: Multidetector CT imaging of the abdomen and pelvis was performed using the standard protocol following bolus administration of intravenous contrast. RADIATION DOSE REDUCTION: This exam was performed according to the departmental dose-optimization program which includes automated exposure control, adjustment of the mA and/or kV according to patient size and/or use of iterative reconstruction technique. CONTRAST:  OMNIPAQUE IOHEXOL 300 MG/ML  SOLN COMPARISON:  06/15/2022 FINDINGS: Lower chest: No acute abnormality. Hepatobiliary: No focal liver abnormality is seen. Status post cholecystectomy. No biliary dilatation. Pancreas: Unremarkable. No pancreatic ductal dilatation or surrounding inflammatory changes. Spleen: Normal in size without focal abnormality. Adrenals/Urinary Tract: Adrenal glands are within normal limits. Kidneys demonstrate a normal enhancement pattern bilaterally. No renal calculi or obstructive changes are noted. Bladder is decompressed. Stomach/Bowel: No obstructive or inflammatory changes of the colon are noted. Appendix is not well seen and may have been surgically removed. No inflammatory changes to suggest appendicitis are noted. The small bowel and stomach are unremarkable. Vascular/Lymphatic: No significant vascular findings are present. No enlarged abdominal or pelvic lymph nodes. Reproductive: Status post hysterectomy. No adnexal masses. Other: No abdominal wall hernia or abnormality. No abdominopelvic ascites. Musculoskeletal: No acute or significant osseous findings. IMPRESSION: No acute abnormality noted. Electronically Signed   By: Alcide Clever M.D.   On: 08/23/2022 01:57    EKG EKG Interpretation  Date/Time:  Friday August 23 2022 00:13:11 EDT Ventricular Rate:  109 PR Interval:  132 QRS Duration: 81 QT Interval:  345 QTC  Calculation: 465 R Axis:   22 Text Interpretation: Sinus tachycardia Low voltage, precordial leads Confirmed by Nicanor Alcon, Arpi Diebold ([id number]) on 08/23/2022 12:46:50 AM  Radiology CT ABDOMEN PELVIS W CONTRAST  Result Date: 08/23/2022 CLINICAL DATA:  Right-sided flank pain for 1 week EXAM: CT ABDOMEN AND PELVIS WITH CONTRAST TECHNIQUE: Multidetector CT imaging of the abdomen and pelvis was performed using the standard protocol following bolus administration of intravenous contrast. RADIATION DOSE REDUCTION: This exam was performed according to the departmental dose-optimization program which includes automated exposure control, adjustment of the mA and/or kV according to patient size and/or use of iterative reconstruction technique. CONTRAST:  OMNIPAQUE IOHEXOL 300 MG/ML  SOLN COMPARISON:  06/15/2022 FINDINGS: Lower chest: No acute abnormality. Hepatobiliary: No focal liver abnormality is seen. Status post cholecystectomy. No biliary dilatation. Pancreas: Unremarkable. No pancreatic ductal dilatation or surrounding inflammatory changes. Spleen: Normal in size without focal abnormality. Adrenals/Urinary Tract: Adrenal glands are within normal limits. Kidneys demonstrate a normal enhancement pattern bilaterally. No renal calculi or obstructive changes are noted. Bladder is decompressed. Stomach/Bowel: No obstructive or inflammatory changes of the colon are noted. Appendix is not well seen and may have been surgically removed. No inflammatory changes to suggest appendicitis are noted. The small bowel and stomach are unremarkable. Vascular/Lymphatic: No significant vascular findings are present. No enlarged abdominal or pelvic lymph nodes. Reproductive: Status post hysterectomy. No adnexal masses. Other: No abdominal wall hernia or abnormality. No abdominopelvic ascites. Musculoskeletal: No acute or significant osseous findings. IMPRESSION: No acute abnormality noted. Electronically Signed   By: Alcide Clever M.D.   On:  08/23/2022 01:57    Procedures Procedures    Medications Ordered in ED Medications  droperidol (INAPSINE) 2.5 MG/ML injection 1.25 mg (1.25 mg Intravenous Not Given 08/23/22 0048)  ketorolac (TORADOL) 30 MG/ML injection 30 mg (30 mg Intravenous Given 08/23/22 0043)  diphenhydrAMINE (BENADRYL) injection 12.5  mg (12.5 mg Intravenous Given 08/23/22 0040)  iohexol (OMNIPAQUE) 300 MG/ML solution 125 mL (125 mLs Intravenous Contrast Given 08/23/22 0130)  fentaNYL (SUBLIMAZE) injection 100 mcg (100 mcg Intravenous Given 08/23/22 0225)  oxyCODONE (Oxy IR/ROXICODONE) immediate release tablet 10 mg (10 mg Oral Given 08/23/22 0307)  ketamine (KETALAR) injection 20 mg (20 mg Intravenous Given 08/23/22 0307)    ED Course/ Medical Decision Making/ A&P                             Medical Decision Making Amount and/or Complexity of Data Reviewed Labs: ordered.    Details: White count elevated 20.2 (on steroids), normal hemoglobin 13.3, normal platelets normal sodium 136, normal potassium 4, normal creatinine .89, normal LFTs Radiology: ordered and independent interpretation performed.    Details: CT negative by me   Risk Prescription drug management. Risk Details: Patient is on steroids and this is the likely cause of white count elevation,  CT is normal.  Patient is having ongoing chronic pain issues.  Patient was pain free post ketamine and stable for discharge.  Strict return.      Final Clinical Impression(s) / ED Diagnoses Final diagnoses:  Right flank pain   Return for intractable cough, coughing up blood, fevers > 100.4 unrelieved by medication, shortness of breath, intractable vomiting, chest pain, shortness of breath, weakness, numbness, changes in speech, facial asymmetry, abdominal pain, passing out, Inability to tolerate liquids or food, cough, altered mental status or any concerns. No signs of systemic illness or infection. The patient is nontoxic-appearing on exam and vital signs are within  normal limits.  I have reviewed the triage vital signs and the nursing notes. Pertinent labs & imaging results that were available during my care of the patient were reviewed by me and considered in my medical decision making (see chart for details). After history, exam, and medical workup I feel the patient has been appropriately medically screened and is safe for discharge home. Pertinent diagnoses were discussed with the patient. Patient was given return precautions Rx / DC Orders ED Discharge Orders     None         Amy Braun, MD 08/23/22 [phone]

## 2022-08-23 NOTE — ED Notes (Signed)
[employer] labels would not print

## 2022-08-30 ENCOUNTER — Other Ambulatory Visit: Payer: Self-pay

## 2022-08-30 ENCOUNTER — Encounter (HOSPITAL_BASED_OUTPATIENT_CLINIC_OR_DEPARTMENT_OTHER): Payer: Self-pay | Admitting: Emergency Medicine

## 2022-08-30 ENCOUNTER — Emergency Department (HOSPITAL_BASED_OUTPATIENT_CLINIC_OR_DEPARTMENT_OTHER)
Admission: EM | Admit: 2022-08-30 | Discharge: 2022-08-30 | Disposition: A | Discharge status: Home / Self Care | Payer: Medicaid Other | Attending: Emergency Medicine | Admitting: Emergency Medicine

## 2022-08-30 ENCOUNTER — Emergency Department (HOSPITAL_BASED_OUTPATIENT_CLINIC_OR_DEPARTMENT_OTHER): Payer: Medicaid Other

## 2022-08-30 DIAGNOSIS — R109 Unspecified abdominal pain: Secondary | ICD-10-CM | POA: Insufficient documentation

## 2022-08-30 DIAGNOSIS — R319 Hematuria, unspecified: Secondary | ICD-10-CM | POA: Insufficient documentation

## 2022-08-30 LAB — LIPASE, BLOOD: Lipase: 29 U/L (ref 11–51)

## 2022-08-30 LAB — CBC WITH DIFFERENTIAL/PLATELET
Abs Immature Granulocytes: 0.11 10*3/uL — ABNORMAL HIGH (ref 0.00–0.07)
Basophils Absolute: 0 10*3/uL (ref 0.0–0.1)
Basophils Relative: 0 %
Eosinophils Absolute: 0.1 10*3/uL (ref 0.0–0.5)
Eosinophils Relative: 1 %
HCT: 37 % (ref 36.0–46.0)
Hemoglobin: 13.2 g/dL (ref 12.0–15.0)
Immature Granulocytes: 1 %
Lymphocytes Relative: 28 %
Lymphs Abs: 4.3 10*3/uL — ABNORMAL HIGH (ref 0.7–4.0)
MCH: 29.3 pg (ref 26.0–34.0)
MCHC: 35.7 g/dL (ref 30.0–36.0)
MCV: 82 fL (ref 80.0–100.0)
Monocytes Absolute: 1 10*3/uL (ref 0.1–1.0)
Monocytes Relative: 7 %
Neutro Abs: 9.8 10*3/uL — ABNORMAL HIGH (ref 1.7–7.7)
Neutrophils Relative %: 63 %
Platelets: 400 10*3/uL (ref 150–400)
RBC: 4.51 MIL/uL (ref 3.87–5.11)
RDW: 15.1 % (ref 11.5–15.5)
WBC: 15.3 10*3/uL — ABNORMAL HIGH (ref 4.0–10.5)
nRBC: 0 % (ref 0.0–0.2)

## 2022-08-30 LAB — COMPREHENSIVE METABOLIC PANEL
ALT: 15 U/L (ref 0–44)
AST: 16 U/L (ref 15–41)
Albumin: 3.7 g/dL (ref 3.5–5.0)
Alkaline Phosphatase: 55 U/L (ref 38–126)
Anion gap: 10 (ref 5–15)
BUN: 9 mg/dL (ref 6–20)
CO2: 27 mmol/L (ref 22–32)
Calcium: 8.7 mg/dL — ABNORMAL LOW (ref 8.9–10.3)
Chloride: 101 mmol/L (ref 98–111)
Creatinine, Ser: 0.8 mg/dL (ref 0.44–1.00)
GFR, Estimated: >60 mL/min (ref >60)
Glucose, Bld: 92 mg/dL (ref 70–99)
Potassium: 3.5 mmol/L (ref 3.5–5.1)
Sodium: 138 mmol/L (ref 135–145)
Total Bilirubin: 0.3 mg/dL (ref 0.3–1.2)
Total Protein: 8 g/dL (ref 6.5–8.1)

## 2022-08-30 MED ORDER — ONDANSETRON HCL 4 MG/2ML IJ SOLN
4.0000 mg | Freq: Once | INTRAMUSCULAR | Status: AC
  Administered 2022-08-30: 4 mg via INTRAVENOUS
  Filled 2022-08-30: qty 2

## 2022-08-30 MED ORDER — OXYCODONE HCL 5 MG PO TABS: 5.0000 mg | ORAL_TABLET | Freq: Four times a day (QID) | ORAL | 0 refills | Status: DC | PRN

## 2022-08-30 MED ORDER — HYDROMORPHONE HCL 1 MG/ML IJ SOLN
1.0000 mg | Freq: Once | INTRAMUSCULAR | Status: AC
  Administered 2022-08-30: 1 mg via INTRAVENOUS
  Filled 2022-08-30: qty 1

## 2022-08-30 NOTE — ED Triage Notes (Signed)
Pt c/o right sided flank pain and hematuria that started this morning. Denies other urinary sx. Concerned it is a lupus flare up. Pt seen on 6/7 for same.

## 2022-08-30 NOTE — Discharge Instructions (Signed)
Please continue further pain management with primary care doctor.  Please return

## 2022-08-30 NOTE — ED Provider Notes (Signed)
[facility] Provider Note   CSN: [id number] Arrival date & time: 08/30/22  2041     History  Chief Complaint  Patient presents with   Flank Pain   Hematuria    Amy Mathis is a 42 y.o. female.  Patient here with right-sided flank pain started this morning.  History of lupus, IBS, kidney stones.  Does not currently have a primary care doctor.  Just finished a course of steroids for lupus.  Denies any nausea vomiting or diarrhea.  She currently does not have a primary care doctor but does get reestablished next week.  She has chronic pain as well but does not have consistent pain management. Patient not having any urinary symtoms  The history is provided by the patient.       Home Medications Prior to Admission medications   Medication Sig Start Date End Date Taking? Authorizing Provider  oxyCODONE (ROXICODONE) 5 MG immediate release tablet Take 1 tablet (5 mg total) by mouth every 6 (six) hours as needed for up to 15 doses. 08/30/22  Yes Amoy Steeves, DO  ciprofloxacin (CIPRO) 500 MG tablet Take 1 tablet (500 mg total) by mouth 2 (two) times daily. One po bid x 7 days 08/16/22   Molpus, John, MD  cyclobenzaprine (FLEXERIL) 10 MG tablet Take 1 tablet (10 mg total) by mouth 2 (two) times daily as needed for muscle spasms. 06/15/22   Sherian Maroon A, PA  diclofenac Sodium (VOLTAREN) 1 % GEL Apply 4 g topically 4 (four) times daily. Apply to affected areas 4 times daily as needed for pain. 09/24/21   Theadora Rama Scales, PA-C  fluconazole (DIFLUCAN) 150 MG tablet Take 1 tablet as needed for vaginal yeast infection.  May repeat in 3 days if symptoms persist. 08/16/22   Molpus, Jonny Ruiz, MD  hydroxychloroquine (PLAQUENIL) 200 MG tablet Take by mouth 2 (two) times daily.    [provider]  methocarbamol (ROBAXIN) 500 MG tablet Take 1 tablet (500 mg total) by mouth 2 (two) times daily. 05/11/22   Jeannie Fend, PA-C  predniSONE  (DELTASONE) 50 MG tablet Take 1 tablet (50 mg total) by mouth daily with breakfast. 07/20/22   Charlynne Pander, MD  promethazine (PHENERGAN) 25 MG tablet Take 1 tablet (25 mg total) by mouth every 6 (six) hours as needed for nausea or vomiting. 07/16/22   Molpus, Jonny Ruiz, MD      Allergies    Morphine, Morphine and codeine, Penicillin g, Penicillins, Tramadol, Meperidine hcl, and Nsaids    Review of Systems   Review of Systems  Physical Exam Updated Vital Signs  ED Triage Vitals [08/30/22 2049]  Enc Vitals Group     BP (!) 109/98     Pulse Rate 99     Resp 17     Temp 98.1 F (36.7 C)     Temp Source Oral     SpO2 97 %     Weight (!) 326 lb (147.9 kg)     Height 5\' 10"  (1.778 m)     Head Circumference      Peak Flow      Pain Score 9     Pain Loc      Pain Edu?      Excl. in GC?      Physical Exam Vitals and nursing note reviewed.  Constitutional:      General: She is in acute distress.     Appearance: She is well-developed. She  is not ill-appearing.  HENT:     Head: Normocephalic and atraumatic.     Nose: Nose normal.     Mouth/Throat:     Mouth: Mucous membranes are moist.  Eyes:     Extraocular Movements: Extraocular movements intact.     Conjunctiva/sclera: Conjunctivae normal.     Pupils: Pupils are equal, round, and reactive to light.  Cardiovascular:     Rate and Rhythm: Normal rate and regular rhythm.     Pulses: Normal pulses.     Heart sounds: Normal heart sounds. No murmur heard. Pulmonary:     Effort: Pulmonary effort is normal. No respiratory distress.     Breath sounds: Normal breath sounds.  Abdominal:     Palpations: Abdomen is soft.     Tenderness: There is no abdominal tenderness. There is right CVA tenderness.  Musculoskeletal:        General: No swelling.     Cervical back: Neck supple.  Skin:    General: Skin is warm and dry.     Capillary Refill: Capillary refill takes less than 2 seconds.  Neurological:     General: No focal deficit  present.     Mental Status: She is alert.  Psychiatric:        Mood and Affect: Mood normal.     ED Results / Procedures / Treatments   Labs (all labs ordered are listed, but only abnormal results are displayed) Labs Reviewed  CBC WITH DIFFERENTIAL/PLATELET - Abnormal; Notable for the following components:      Result Value   WBC 15.3 (*)    Neutro Abs 9.8 (*)    Lymphs Abs 4.3 (*)    Abs Immature Granulocytes 0.11 (*)    All other components within normal limits  COMPREHENSIVE METABOLIC PANEL - Abnormal; Notable for the following components:   Calcium 8.7 (*)    All other components within normal limits  LIPASE, BLOOD    EKG None  Radiology CT Renal Stone Study  Result Date: 08/30/2022 CLINICAL DATA:  Abdominal and flank pain with stone suspected. Right-sided flank pain and hematuria starting this morning. EXAM: CT ABDOMEN AND PELVIS WITHOUT CONTRAST TECHNIQUE: Multidetector CT imaging of the abdomen and pelvis was performed following the standard protocol without IV contrast. RADIATION DOSE REDUCTION: This exam was performed according to the departmental dose-optimization program which includes automated exposure control, adjustment of the mA and/or kV according to patient size and/or use of iterative reconstruction technique. COMPARISON:  08/23/2022 and 06/15/2022 FINDINGS: Lower chest: Lung bases are clear. Hepatobiliary: No focal liver lesions. No bile duct dilatation. Gallbladder is not visualized, possibly contracted or surgically absent. Pancreas: Low-attenuation changes suggested in the head of the pancreas. No significant peripancreatic stranding or fluid. Changes may represent early acute pancreatitis. Laboratory correlation is suggested. No pancreatic ductal dilatation or collection. Spleen: Normal in size without focal abnormality. Adrenals/Urinary Tract: No adrenal gland nodules. A tiny punctate stone in the left lower pole measuring less than 2 mm. No hydronephrosis or  hydroureter. Kidneys are symmetrical. No ureteral or bladder stones. Bladder wall is not thickened. Stomach/Bowel: Stomach, small bowel, and colon are not abnormally distended. No wall thickening or inflammatory changes are appreciated. A short appendix or appendiceal stump is demonstrated without inflammatory change. Vascular/Lymphatic: No significant vascular findings are present. No enlarged abdominal or pelvic lymph nodes. Reproductive: No pelvic mass. Other: No abdominal wall hernia or abnormality. No abdominopelvic ascites. Musculoskeletal: No acute or significant osseous findings. IMPRESSION: 1. Punctate sized nonobstructing  stone in the lower pole left kidney. No ureteral stone or obstruction. 2. Vague low-attenuation change in the head of the pancreas without stranding, ductal dilatation, or collection. Changes may indicate early acute pancreatitis. Correlate with laboratory findings. Electronically Signed   By: Burman Nieves M.D.   On: 08/30/2022 22:58    Procedures Procedures    Medications Ordered in ED Medications  HYDROmorphone (DILAUDID) injection 1 mg (1 mg Intravenous Given 08/30/22 2233)  ondansetron (ZOFRAN) injection 4 mg (4 mg Intravenous Given 08/30/22 2233)    ED Course/ Medical Decision Making/ A&P                             Medical Decision Making Amount and/or Complexity of Data Reviewed Labs: ordered. Radiology: ordered.  Risk Prescription drug management.   Tylecia Kinman is here with right-sided flank pain.  History of lupus, kidney stones, chronic pain.  Unremarkable vitals.  No fever.  Differential diagnosis likely musculoskeletal/acute on chronic pain, seems less likely to be kidney stone or gallbladder or pancreatitis issue.  Similar pains in the past.  She is otherwise well-appearing.  Does not have any pain with urination.  She somewhat fairly has hematuria chronically.  Overall vital signs reassuring.  Will give IV Dilaudid and check CBC, CMP, lipase and  CT scan abdomen pelvis.  She has had prior hysterectomy.  Per my review interpretation labs no significant anemia or electrolyte abnormality or kidney injury.  CT scan is unremarkable.  Lipase is normal and I have no concern for pancreatitis.  Patient discharged with short course prescription for narcotic pain medicine.  She needs to establish with primary care doctor in order to have chronic pain management.  She states that she has an appointment next week.  She is not having any urinary symptoms.  Discharged.  This chart was dictated using voice recognition software.  Despite best efforts to proofread,  errors can occur which can change the documentation meaning.         Final Clinical Impression(s) / ED Diagnoses Final diagnoses:  Right flank pain    Rx / DC Orders ED Discharge Orders          Ordered    oxyCODONE (ROXICODONE) 5 MG immediate release tablet  Every 6 hours PRN        08/30/22 2318              Virgina Norfolk, DO 08/30/22 2331

## 2022-08-30 NOTE — Progress Notes (Signed)
RT unable to obtain IV access. 

## 2022-08-31 MED ORDER — OXYCODONE HCL 5 MG PO TABS: 5.0000 mg | ORAL_TABLET | Freq: Three times a day (TID) | ORAL | 0 refills | Status: DC | PRN

## 2022-09-14 ENCOUNTER — Emergency Department (HOSPITAL_BASED_OUTPATIENT_CLINIC_OR_DEPARTMENT_OTHER)
Admission: EM | Admit: 2022-09-14 | Discharge: 2022-09-14 | Disposition: A | Discharge status: Home / Self Care | Payer: Medicaid Other | Attending: Emergency Medicine | Admitting: Emergency Medicine

## 2022-09-14 ENCOUNTER — Encounter (HOSPITAL_BASED_OUTPATIENT_CLINIC_OR_DEPARTMENT_OTHER): Payer: Self-pay | Admitting: Emergency Medicine

## 2022-09-14 ENCOUNTER — Other Ambulatory Visit: Payer: Self-pay

## 2022-09-14 DIAGNOSIS — R310 Gross hematuria: Secondary | ICD-10-CM | POA: Diagnosis not present

## 2022-09-14 DIAGNOSIS — R1033 Periumbilical pain: Secondary | ICD-10-CM | POA: Insufficient documentation

## 2022-09-14 DIAGNOSIS — G8929 Other chronic pain: Secondary | ICD-10-CM

## 2022-09-14 DIAGNOSIS — R112 Nausea with vomiting, unspecified: Secondary | ICD-10-CM | POA: Diagnosis present

## 2022-09-14 LAB — COMPREHENSIVE METABOLIC PANEL
ALT: 18 U/L (ref 0–44)
AST: 21 U/L (ref 15–41)
Albumin: 3.6 g/dL (ref 3.5–5.0)
Alkaline Phosphatase: 47 U/L (ref 38–126)
Anion gap: 8 (ref 5–15)
BUN: 5 mg/dL — ABNORMAL LOW (ref 6–20)
CO2: 26 mmol/L (ref 22–32)
Calcium: 8.7 mg/dL — ABNORMAL LOW (ref 8.9–10.3)
Chloride: 103 mmol/L (ref 98–111)
Creatinine, Ser: 0.67 mg/dL (ref 0.44–1.00)
GFR, Estimated: >60 mL/min (ref >60)
Glucose, Bld: 89 mg/dL (ref 70–99)
Potassium: 4 mmol/L (ref 3.5–5.1)
Sodium: 137 mmol/L (ref 135–145)
Total Bilirubin: 0.3 mg/dL (ref 0.3–1.2)
Total Protein: 7.9 g/dL (ref 6.5–8.1)

## 2022-09-14 LAB — CBC
HCT: 36.9 % (ref 36.0–46.0)
Hemoglobin: 13.1 g/dL (ref 12.0–15.0)
MCH: 29.4 pg (ref 26.0–34.0)
MCHC: 35.5 g/dL (ref 30.0–36.0)
MCV: 82.9 fL (ref 80.0–100.0)
Platelets: 363 10*3/uL (ref 150–400)
RBC: 4.45 MIL/uL (ref 3.87–5.11)
RDW: 15 % (ref 11.5–15.5)
WBC: 10.3 10*3/uL (ref 4.0–10.5)
nRBC: 0 % (ref 0.0–0.2)

## 2022-09-14 LAB — LIPASE, BLOOD: Lipase: 32 U/L (ref 11–51)

## 2022-09-14 MED ORDER — PROMETHAZINE HCL 25 MG/ML IJ SOLN
25.0000 mg | Freq: Once | INTRAMUSCULAR | Status: AC
  Administered 2022-09-14: 25 mg via INTRAMUSCULAR
  Filled 2022-09-14: qty 1

## 2022-09-14 MED ORDER — HYDROMORPHONE HCL 1 MG/ML IJ SOLN
1.0000 mg | Freq: Once | INTRAMUSCULAR | Status: AC
  Administered 2022-09-14: 1 mg via INTRAMUSCULAR
  Filled 2022-09-14: qty 1

## 2022-09-14 MED ORDER — PROMETHAZINE HCL 25 MG PO TABS: 25.0000 mg | ORAL_TABLET | Freq: Four times a day (QID) | ORAL | 0 refills | Status: AC | PRN

## 2022-09-14 NOTE — ED Triage Notes (Signed)
Pt reports RT flank and RT side abd pain w/ vomiting since yesterday; reports hematuria

## 2022-09-14 NOTE — Discharge Instructions (Addendum)
Please read and follow all provided instructions.  Your diagnoses today include:  1. Nausea and vomiting, unspecified vomiting type   2. Chronic abdominal pain   3. Gross hematuria     Tests performed today include: Blood cell counts and platelets: normal white blood cell count today and hemoglobin Kidney and liver function tests: Electrolytes and kidney function are normal Pancreas function test (called lipase) Vital signs. See below for your results today.   Medications prescribed:  Phenergan (promethazine) - for nausea and vomiting  Take any prescribed medications only as directed.  Home care instructions:  Follow any educational materials contained in this packet.  Follow-up instructions: Please follow-up with your primary care provider in the next 7 days for further evaluation of your symptoms.    Return instructions:  SEEK IMMEDIATE MEDICAL ATTENTION IF: The pain does not go away or becomes severe  A temperature above 101F develops  Repeated vomiting occurs (multiple episodes)  The pain becomes localized to portions of the abdomen. The right side could possibly be appendicitis. In an adult, the left lower portion of the abdomen could be colitis or diverticulitis.  Blood is being passed in stools or vomit (bright red or black tarry stools)  You develop chest pain, difficulty breathing, dizziness or fainting, or become confused, poorly responsive, or inconsolable (young children) If you have any other emergent concerns regarding your health  Additional Information: Abdominal (belly) pain can be caused by many things. Your caregiver performed an examination and possibly ordered blood/urine tests and imaging (CT scan, x-rays, ultrasound). Many cases can be observed and treated at home after initial evaluation in the emergency department. Even though you are being discharged home, abdominal pain can be unpredictable. Therefore, you need a repeated exam if your pain does not  resolve, returns, or worsens. Most patients with abdominal pain don't have to be admitted to the hospital or have surgery, but serious problems like appendicitis and gallbladder attacks can start out as nonspecific pain. Many abdominal conditions cannot be diagnosed in one visit, so follow-up evaluations are very important.  Your vital signs today were: BP (!) 137/95   Pulse 89   Temp 99.1 F (37.3 C) (Oral)   Resp 17   Ht 5\' 10"  (1.778 m)   Wt (!) 147.9 kg   SpO2 100%   BMI 46.78 kg/m  If your blood pressure (bp) was elevated above 135/85 this visit, please have this repeated by your doctor within one month. --------------

## 2022-09-14 NOTE — ED Notes (Signed)
Pt provided ginger ale, per her request.  

## 2022-09-14 NOTE — ED Provider Notes (Signed)
[place] [facility] Provider Note   CSN: [id number] Arrival date & time: 09/14/22  1532     History  Chief Complaint  Patient presents with   Abdominal Pain    Amy Mathis is a 42 y.o. female.  Patient with history of chronic pain, reported history of lupus, chronic hematuria, frequent ED utilizer --presents to the emergency department for flare of chronic symptoms.  She states that she has been vomiting for 3 days (triage note states symptoms started yesterday) and mid abdominal pain with radiation to the right flank.  She has been using medications at home without improvement.  She continues to have hematuria.  Currently she states that she is still having trouble with her insurance and obtaining PCP.  Symptoms are consistent with previous signs of chronic pain.  Patient has had 4 CT scans of the abdomen and pelvis over the past 6 months which have been negative.  She has been seen 17 times over this period for similar symptoms. The onset of this condition was acute. The course is constant. Aggravating factors: none. Alleviating factors: none.         Home Medications Prior to Admission medications   Medication Sig Start Date End Date Taking? Authorizing Provider  ciprofloxacin (CIPRO) 500 MG tablet Take 1 tablet (500 mg total) by mouth 2 (two) times daily. One po bid x 7 days 08/16/22   Molpus, John, MD  cyclobenzaprine (FLEXERIL) 10 MG tablet Take 1 tablet (10 mg total) by mouth 2 (two) times daily as needed for muscle spasms. 06/15/22   Sherian Maroon A, PA  diclofenac Sodium (VOLTAREN) 1 % GEL Apply 4 g topically 4 (four) times daily. Apply to affected areas 4 times daily as needed for pain. 09/24/21   Theadora Rama Scales, PA-C  fluconazole (DIFLUCAN) 150 MG tablet Take 1 tablet as needed for vaginal yeast infection.  May repeat in 3 days if symptoms persist. 08/16/22   Molpus, Jonny Ruiz, MD  hydroxychloroquine (PLAQUENIL) 200 MG tablet Take by  mouth 2 (two) times daily.    [provider]  methocarbamol (ROBAXIN) 500 MG tablet Take 1 tablet (500 mg total) by mouth 2 (two) times daily. 05/11/22   Jeannie Fend, PA-C  oxyCODONE (ROXICODONE) 5 MG immediate release tablet Take 1 tablet (5 mg total) by mouth every 8 (eight) hours as needed for up to 10 doses for severe pain. 08/31/22   Rancour, Jeannett Senior, MD  predniSONE (DELTASONE) 50 MG tablet Take 1 tablet (50 mg total) by mouth daily with breakfast. 07/20/22   Charlynne Pander, MD  promethazine (PHENERGAN) 25 MG tablet Take 1 tablet (25 mg total) by mouth every 6 (six) hours as needed for nausea or vomiting. 07/16/22   Molpus, Jonny Ruiz, MD      Allergies    Morphine, Morphine and codeine, Penicillin g, Penicillins, Tramadol, Meperidine hcl, and Nsaids    Review of Systems   Review of Systems  Physical Exam Updated Vital Signs BP (!) 157/65 (BP Location: Right Arm)   Pulse 100   Temp 99.1 F (37.3 C) (Oral)   Resp 20   Ht 5\' 10"  (1.778 m)   Wt (!) 147.9 kg   SpO2 96%   BMI 46.78 kg/m  Physical Exam Vitals and nursing note reviewed.  Constitutional:      General: She is not in acute distress.    Appearance: She is well-developed.  HENT:     Head: Normocephalic and atraumatic.  Right Ear: External ear normal.     Left Ear: External ear normal.     Nose: Nose normal.  Eyes:     Conjunctiva/sclera: Conjunctivae normal.  Cardiovascular:     Rate and Rhythm: Normal rate and regular rhythm.     Heart sounds: No murmur heard. Pulmonary:     Effort: No respiratory distress.     Breath sounds: No wheezing, rhonchi or rales.  Abdominal:     Palpations: Abdomen is soft.     Tenderness: There is abdominal tenderness in the periumbilical area. There is no guarding or rebound. Negative signs include Murphy's sign and McBurney's sign.  Musculoskeletal:     Cervical back: Normal range of motion and neck supple.     Right lower leg: No edema.     Left lower leg: No edema.   Skin:    General: Skin is warm and dry.     Findings: No rash.  Neurological:     General: No focal deficit present.     Mental Status: She is alert. Mental status is at baseline.     Motor: No weakness.  Psychiatric:        Mood and Affect: Mood normal.     ED Results / Procedures / Treatments   Labs (all labs ordered are listed, but only abnormal results are displayed) Labs Reviewed  COMPREHENSIVE METABOLIC PANEL - Abnormal; Notable for the following components:      Result Value   BUN 5 (*)    Calcium 8.7 (*)    All other components within normal limits  LIPASE, BLOOD  CBC    EKG None  Radiology No results found.  Procedures Procedures    Medications Ordered in ED Medications  HYDROmorphone (DILAUDID) injection 1 mg (1 mg Intramuscular Given 09/14/22 1801)  promethazine (PHENERGAN) injection 25 mg (25 mg Intramuscular Given 09/14/22 1801)    ED Course/ Medical Decision Making/ A&P    Patient seen and examined. History obtained directly from patient.   Labs/EKG: Ordered CBC, CMP, lipase, UA  Imaging: None ordered  Medications/Fluids: Will give 1 dose of IM Dilaudid 1 mg and Phenergan and p.o. challenge.  Patient clinically does not appear to be dehydrated.  Her mucous membranes are moist her pulse rate is 100 and she is not hypotensive do not feel strongly that she needs IV hydration unless she is persistently vomiting.  Most recent vital signs reviewed and are as follows: BP (!) 157/65 (BP Location: Right Arm)   Pulse 100   Temp 99.1 F (37.3 C) (Oral)   Resp 20   Ht 5\' 10"  (1.778 m)   Wt (!) 147.9 kg   SpO2 96%   BMI 46.78 kg/m   Initial impression: Exacerbation of chronic pain symptoms  6:30 PM Reassessment performed. Patient appears stable.  She is in the room drinking fluids.  Labs personally reviewed and interpreted including: CBC unremarkable with normal white blood cell count and normal hemoglobin; CMP normal creatinine, electrolytes and  liver function testing; lipase normal.  Reviewed pertinent lab work and imaging with patient at bedside. Questions answered.   Most current vital signs reviewed and are as follows: BP (!) 137/95   Pulse 89   Temp 99.1 F (37.3 C) (Oral)   Resp 17   Ht 5\' 10"  (1.778 m)   Wt (!) 147.9 kg   SpO2 100%   BMI 46.78 kg/m   Plan: Discharge to home.   Prescriptions written for: Promethazine  Other home care  instructions discussed: Parke Simmers diet  ED return instructions discussed: Uncontrolled symptoms  Follow-up instructions discussed: Patient encouraged to follow-up with their PCP in 7 days.                               Medical Decision Making Amount and/or Complexity of Data Reviewed Labs: ordered.  Risk Prescription drug management.   For this patient's complaint of abdominal pain, the following conditions were considered on the differential diagnosis: gastritis/PUD, enteritis/duodenitis, appendicitis, cholelithiasis/cholecystitis, cholangitis, pancreatitis, ruptured viscus, colitis, diverticulitis, small/large bowel obstruction, proctitis, cystitis, pyelonephritis, ureteral colic, aortic dissection, aortic aneurysm. In women, ectopic pregnancy, pelvic inflammatory disease, ovarian cysts, and tubo-ovarian abscess were also considered. Atypical chest etiologies were also considered including ACS, PE, and pneumonia.  Symptoms today are consistent with patient's chronic symptoms.  No evidence of sepsis, renal failure, severe dehydration.  She is tolerating oral fluids in the ED.  The patient's vital signs, pertinent lab work and imaging were reviewed and interpreted as discussed in the ED course. Hospitalization was considered for further testing, treatments, or serial exams/observation. However as patient is well-appearing, has a stable exam, and reassuring studies today, I do not feel that they warrant admission at this time. This plan was discussed with the patient who verbalizes  agreement and comfort with this plan and seems reliable and able to return to the Emergency Department with worsening or changing symptoms.             Final Clinical Impression(s) / ED Diagnoses Final diagnoses:  Nausea and vomiting, unspecified vomiting type  Chronic abdominal pain  Gross hematuria    Rx / DC Orders ED Discharge Orders          Ordered    promethazine (PHENERGAN) 25 MG tablet  Every 6 hours PRN        09/14/22 1828              Renne Crigler, PA-C 09/14/22 1832    Gwyneth Sprout, MD 09/14/22 2252

## 2022-09-15 ENCOUNTER — Encounter (HOSPITAL_BASED_OUTPATIENT_CLINIC_OR_DEPARTMENT_OTHER): Payer: Self-pay

## 2022-09-15 ENCOUNTER — Emergency Department (HOSPITAL_BASED_OUTPATIENT_CLINIC_OR_DEPARTMENT_OTHER)
Admission: EM | Admit: 2022-09-15 | Discharge: 2022-09-16 | Disposition: A | Discharge status: Home / Self Care | Payer: Medicaid Other | Attending: Emergency Medicine | Admitting: Emergency Medicine

## 2022-09-15 ENCOUNTER — Other Ambulatory Visit: Payer: Self-pay

## 2022-09-15 DIAGNOSIS — R109 Unspecified abdominal pain: Secondary | ICD-10-CM | POA: Diagnosis present

## 2022-09-15 DIAGNOSIS — R1084 Generalized abdominal pain: Secondary | ICD-10-CM | POA: Diagnosis not present

## 2022-09-15 LAB — URINALYSIS, ROUTINE W REFLEX MICROSCOPIC

## 2022-09-15 LAB — URINALYSIS, MICROSCOPIC (REFLEX)

## 2022-09-15 MED ORDER — HYDROMORPHONE HCL 1 MG/ML IJ SOLN
1.0000 mg | Freq: Once | INTRAMUSCULAR | Status: AC
  Administered 2022-09-15: 1 mg via INTRAVENOUS
  Filled 2022-09-15: qty 1

## 2022-09-15 MED ORDER — PANTOPRAZOLE SODIUM 40 MG IV SOLR
40.0000 mg | Freq: Once | INTRAVENOUS | Status: AC
  Administered 2022-09-15: 40 mg via INTRAVENOUS
  Filled 2022-09-15: qty 10

## 2022-09-15 MED ORDER — ONDANSETRON HCL 4 MG/2ML IJ SOLN
4.0000 mg | Freq: Once | INTRAMUSCULAR | Status: AC
  Administered 2022-09-15: 4 mg via INTRAVENOUS
  Filled 2022-09-15: qty 2

## 2022-09-15 MED ORDER — LACTATED RINGERS IV BOLUS
1000.0000 mL | Freq: Once | INTRAVENOUS | Status: AC
  Administered 2022-09-15: 1000 mL via INTRAVENOUS

## 2022-09-15 NOTE — ED Provider Notes (Signed)
[facility] Provider Note   CSN: [id number] Arrival date & time: 09/15/22  1801     History {Add pertinent medical, surgical, social history, OB history to HPI:1} Chief Complaint  Patient presents with   Flank Pain   Hematuria    Amy Mathis is a 42 y.o. female.  HPI     42 year old female with a history of IBS, lupus, kidney stones  Yesterday, little sore but not too bad. Given phenergan yesteray which helped with nausea. Woke up at 1230PM today with pain right side, normally goes away when put pressure,  feels like squeezing pain to right side.  Joints sore but used to that. Right side flank coming around to front. Pressure on groin with urinating. No vaginal discharge. 101 this AM prior to taking tylenol.  Diarrhea chronically, IBS.  Was having LUQ pain 2 weeks ago but that has improved. Now is continuing of the right sided pain.     Past Medical History:  Diagnosis Date   IBS (irritable bowel syndrome)    Lupus (HCC)    Past Surgical History:  Procedure Laterality Date   ABDOMINAL HYSTERECTOMY     CHOLECYSTECTOMY     OOPHORECTOMY        Home Medications Prior to Admission medications   Medication Sig Start Date End Date Taking? Authorizing Provider  ciprofloxacin (CIPRO) 500 MG tablet Take 1 tablet (500 mg total) by mouth 2 (two) times daily. One po bid x 7 days 08/16/22   Molpus, John, MD  cyclobenzaprine (FLEXERIL) 10 MG tablet Take 1 tablet (10 mg total) by mouth 2 (two) times daily as needed for muscle spasms. 06/15/22   Sherian Maroon A, PA  diclofenac Sodium (VOLTAREN) 1 % GEL Apply 4 g topically 4 (four) times daily. Apply to affected areas 4 times daily as needed for pain. 09/24/21   Theadora Rama Scales, PA-C  fluconazole (DIFLUCAN) 150 MG tablet Take 1 tablet as needed for vaginal yeast infection.  May repeat in 3 days if symptoms persist. 08/16/22   Molpus, Jonny Ruiz, MD  hydroxychloroquine (PLAQUENIL) 200 MG  tablet Take by mouth 2 (two) times daily.    [provider]  methocarbamol (ROBAXIN) 500 MG tablet Take 1 tablet (500 mg total) by mouth 2 (two) times daily. 05/11/22   Jeannie Fend, PA-C  oxyCODONE (ROXICODONE) 5 MG immediate release tablet Take 1 tablet (5 mg total) by mouth every 8 (eight) hours as needed for up to 10 doses for severe pain. 08/31/22   Rancour, Jeannett Senior, MD  predniSONE (DELTASONE) 50 MG tablet Take 1 tablet (50 mg total) by mouth daily with breakfast. 07/20/22   Charlynne Pander, MD  promethazine (PHENERGAN) 25 MG tablet Take 1 tablet (25 mg total) by mouth every 6 (six) hours as needed for nausea or vomiting. 09/14/22   Renne Crigler, PA-C      Allergies    Morphine, Morphine and codeine, Penicillin g, Penicillins, Tramadol, Meperidine hcl, and Nsaids    Review of Systems   Review of Systems  Physical Exam Updated Vital Signs BP (!) 142/95 (BP Location: Right Arm)   Pulse 99   Temp 98.9 F (37.2 C) (Oral)   Resp (!) 22   Wt (!) 147.9 kg   SpO2 100%   BMI 46.78 kg/m  Physical Exam  ED Results / Procedures / Treatments   Labs (all labs ordered are listed, but only abnormal results are displayed) Labs Reviewed  URINALYSIS, ROUTINE W  REFLEX MICROSCOPIC - Abnormal; Notable for the following components:      Result Value   Color, Urine RED (*)    APPearance TURBID (*)    Glucose, UA   (*)    Value: TEST NOT REPORTED DUE TO COLOR INTERFERENCE OF URINE PIGMENT   Hgb urine dipstick   (*)    Value: TEST NOT REPORTED DUE TO COLOR INTERFERENCE OF URINE PIGMENT   Bilirubin Urine   (*)    Value: TEST NOT REPORTED DUE TO COLOR INTERFERENCE OF URINE PIGMENT   Ketones, ur   (*)    Value: TEST NOT REPORTED DUE TO COLOR INTERFERENCE OF URINE PIGMENT   Protein, ur   (*)    Value: TEST NOT REPORTED DUE TO COLOR INTERFERENCE OF URINE PIGMENT   Nitrite   (*)    Value: TEST NOT REPORTED DUE TO COLOR INTERFERENCE OF URINE PIGMENT   Leukocytes,Ua   (*)    Value:  TEST NOT REPORTED DUE TO COLOR INTERFERENCE OF URINE PIGMENT   All other components within normal limits  URINALYSIS, MICROSCOPIC (REFLEX) - Abnormal; Notable for the following components:   Bacteria, UA FEW (*)    All other components within normal limits    EKG None  Radiology No results found.  Procedures Procedures  {Document cardiac monitor, telemetry assessment procedure when appropriate:1}  Medications Ordered in ED Medications - No data to display  ED Course/ Medical Decision Making/ A&P   {   Click here for ABCD2, HEART and other calculatorsREFRESH Note before signing :1}                          Medical Decision Making Amount and/or Complexity of Data Reviewed Labs: ordered.  Risk Prescription drug management.   ***  Reviewed recent imaging which showed a punctate sized nonobstructing stone in the lower pole of the left kidney, no ureteral stone or obstruction, vague changes of the head of the pancreas without stranding, ductal dilation or collect 08/30/2022, 6/7 CT abdomen pelvis without acute abnormalities  Labs completed yesterday show normal creatinine, no clinically significant electrolyte abnormalities.  Evaluated CBC from yesterday which shows a white blood cell count of 10.3, hemoglobin of 13.1  {Document critical care time when appropriate:1} {Document review of labs and clinical decision tools ie heart score, Chads2Vasc2 etc:1}  {Document your independent review of radiology images, and any outside records:1} {Document your discussion with family members, caretakers, and with consultants:1} {Document social determinants of health affecting pt's care:1} {Document your decision making why or why not admission, treatments were needed:1} Final Clinical Impression(s) / ED Diagnoses Final diagnoses:  None    Rx / DC Orders ED Discharge Orders     None

## 2022-09-15 NOTE — ED Triage Notes (Signed)
Pt arrives with c/o right sided flank pain and hematuria. Pt was seen yesterday for the same. Pt denies n/v.

## 2022-09-16 MED ORDER — PANTOPRAZOLE SODIUM 20 MG PO TBEC: 40.0000 mg | DELAYED_RELEASE_TABLET | Freq: Every day | ORAL | 0 refills | Status: DC

## 2022-09-16 MED ORDER — OXYCODONE HCL 5 MG PO TABS
5.0000 mg | ORAL_TABLET | Freq: Once | ORAL | Status: AC
  Administered 2022-09-16: 5 mg via ORAL
  Filled 2022-09-16: qty 1

## 2022-09-20 ENCOUNTER — Other Ambulatory Visit: Payer: Self-pay

## 2022-09-20 ENCOUNTER — Encounter (HOSPITAL_BASED_OUTPATIENT_CLINIC_OR_DEPARTMENT_OTHER): Payer: Self-pay | Admitting: Emergency Medicine

## 2022-09-20 ENCOUNTER — Emergency Department (HOSPITAL_BASED_OUTPATIENT_CLINIC_OR_DEPARTMENT_OTHER)
Admission: EM | Admit: 2022-09-20 | Discharge: 2022-09-20 | Disposition: A | Discharge status: Home / Self Care | Payer: Medicaid Other | Attending: Emergency Medicine | Admitting: Emergency Medicine

## 2022-09-20 DIAGNOSIS — R109 Unspecified abdominal pain: Secondary | ICD-10-CM | POA: Diagnosis present

## 2022-09-20 DIAGNOSIS — R319 Hematuria, unspecified: Secondary | ICD-10-CM | POA: Insufficient documentation

## 2022-09-20 DIAGNOSIS — G8929 Other chronic pain: Secondary | ICD-10-CM

## 2022-09-20 MED ORDER — HYDROMORPHONE HCL 1 MG/ML IJ SOLN
2.0000 mg | Freq: Once | INTRAMUSCULAR | Status: AC
  Administered 2022-09-20: 2 mg via INTRAMUSCULAR
  Filled 2022-09-20: qty 2

## 2022-09-20 NOTE — ED Triage Notes (Signed)
Patient here with lower back pain.  She states that it is her lupus flaring up.  She states she has some nausea.

## 2022-09-20 NOTE — ED Provider Notes (Signed)
[place] [facility]  Provider Note  CSN: [id number] Arrival date & time: 09/20/22 2241  History Chief Complaint  Patient presents with   Back Pain    Amy Mathis is a 42 y.o. female with previous diagnosis of SLE with chronic flank pain and hematuria and numerous ED visits for same since moving here from [facility] last year. She has struggled with getting insurance and with arranging PCP or Rheum follow up. She is here for similar R flank pain and hematuria. Multiple recent ED visits with negative workups other than grossly bloody urine. She has had 4 CT scans this year alone.    Home Medications Prior to Admission medications   Medication Sig Start Date End Date Taking? Authorizing Provider  ciprofloxacin (CIPRO) 500 MG tablet Take 1 tablet (500 mg total) by mouth 2 (two) times daily. One po bid x 7 days 08/16/22   Molpus, John, MD  cyclobenzaprine (FLEXERIL) 10 MG tablet Take 1 tablet (10 mg total) by mouth 2 (two) times daily as needed for muscle spasms. 06/15/22   Sherian Maroon A, PA  diclofenac Sodium (VOLTAREN) 1 % GEL Apply 4 g topically 4 (four) times daily. Apply to affected areas 4 times daily as needed for pain. 09/24/21   Theadora Rama Scales, PA-C  fluconazole (DIFLUCAN) 150 MG tablet Take 1 tablet as needed for vaginal yeast infection.  May repeat in 3 days if symptoms persist. 08/16/22   Molpus, Jonny Ruiz, MD  hydroxychloroquine (PLAQUENIL) 200 MG tablet Take by mouth 2 (two) times daily.    [provider]  methocarbamol (ROBAXIN) 500 MG tablet Take 1 tablet (500 mg total) by mouth 2 (two) times daily. 05/11/22   Jeannie Fend, PA-C  oxyCODONE (ROXICODONE) 5 MG immediate release tablet Take 1 tablet (5 mg total) by mouth every 8 (eight) hours as needed for up to 10 doses for severe pain. 08/31/22   Rancour, Jeannett Senior, MD  pantoprazole (PROTONIX) 20 MG tablet Take 2 tablets (40 mg total) by mouth daily. 09/16/22   Alvira Monday, MD   predniSONE (DELTASONE) 50 MG tablet Take 1 tablet (50 mg total) by mouth daily with breakfast. 07/20/22   Charlynne Pander, MD  promethazine (PHENERGAN) 25 MG tablet Take 1 tablet (25 mg total) by mouth every 6 (six) hours as needed for nausea or vomiting. 09/14/22   Renne Crigler, PA-C     Allergies    Morphine, Morphine and codeine, Penicillin g, Penicillins, Tramadol, Meperidine hcl, and Nsaids   Review of Systems   Review of Systems Please see HPI for pertinent positives and negatives  Physical Exam BP (!) 146/95 (BP Location: Right Arm)   Pulse 93   Temp 98.7 F (37.1 C) (Oral)   Resp (!) 24   SpO2 98%   Physical Exam Vitals and nursing note reviewed.  Constitutional:      Appearance: Normal appearance. She is obese.  HENT:     Head: Normocephalic and atraumatic.     Nose: Nose normal.     Mouth/Throat:     Mouth: Mucous membranes are moist.  Eyes:     Extraocular Movements: Extraocular movements intact.     Conjunctiva/sclera: Conjunctivae normal.  Cardiovascular:     Rate and Rhythm: Normal rate.  Pulmonary:     Effort: Pulmonary effort is normal.     Breath sounds: Normal breath sounds.  Abdominal:     General: Abdomen is flat.     Palpations: Abdomen is soft.  Tenderness: There is no abdominal tenderness. There is no guarding.  Musculoskeletal:        General: No swelling. Normal range of motion.     Cervical back: Neck supple.  Skin:    General: Skin is warm and dry.  Neurological:     General: No focal deficit present.     Mental Status: She is alert.  Psychiatric:        Mood and Affect: Mood normal.     ED Results / Procedures / Treatments   EKG None  Procedures Procedures  Medications Ordered in the ED Medications  HYDROmorphone (DILAUDID) injection 2 mg (2 mg Intramuscular Given 09/20/22 2329)    Initial Impression and Plan  Patient here with chronic flank pain and hematuria related to her lupus. She has had multiple recent  negative workups in the ED, no indication to repeat today. She will be given a dose of pain medication here for comfort. She was again encouraged to establish with outpatient PCP and Rheum for long term management including in the [facility] system as there is no Rheumatology on-call for [place].   ED Course       MDM Rules/Calculators/A&P Medical Decision Making Problems Addressed: Chronic flank pain: chronic illness or injury with exacerbation, progression, or side effects of treatment  Risk Prescription drug management. Parenteral controlled substances.     Final Clinical Impression(s) / ED Diagnoses Final diagnoses:  Chronic flank pain    Rx / DC Orders ED Discharge Orders     None        Pollyann Savoy, MD 09/20/22 2336

## 2022-09-27 ENCOUNTER — Other Ambulatory Visit: Payer: Self-pay

## 2022-09-27 ENCOUNTER — Emergency Department (HOSPITAL_BASED_OUTPATIENT_CLINIC_OR_DEPARTMENT_OTHER)
Admission: EM | Admit: 2022-09-27 | Discharge: 2022-09-28 | Disposition: A | Discharge status: Home / Self Care | Payer: Medicaid Other | Attending: Emergency Medicine | Admitting: Emergency Medicine

## 2022-09-27 ENCOUNTER — Encounter (HOSPITAL_BASED_OUTPATIENT_CLINIC_OR_DEPARTMENT_OTHER): Payer: Self-pay

## 2022-09-27 DIAGNOSIS — R319 Hematuria, unspecified: Secondary | ICD-10-CM | POA: Insufficient documentation

## 2022-09-27 DIAGNOSIS — R1013 Epigastric pain: Secondary | ICD-10-CM | POA: Diagnosis present

## 2022-09-27 DIAGNOSIS — F419 Anxiety disorder, unspecified: Secondary | ICD-10-CM | POA: Diagnosis not present

## 2022-09-27 LAB — CBC WITH DIFFERENTIAL/PLATELET
Abs Immature Granulocytes: 0.04 10*3/uL (ref 0.00–0.07)
Basophils Absolute: 0.1 10*3/uL (ref 0.0–0.1)
Basophils Relative: 1 %
Eosinophils Absolute: 0.2 10*3/uL (ref 0.0–0.5)
Eosinophils Relative: 2 %
HCT: 34.4 % — ABNORMAL LOW (ref 36.0–46.0)
Hemoglobin: 12.4 g/dL (ref 12.0–15.0)
Immature Granulocytes: 0 %
Lymphocytes Relative: 30 %
Lymphs Abs: 3 10*3/uL (ref 0.7–4.0)
MCH: 29.5 pg (ref 26.0–34.0)
MCHC: 36 g/dL (ref 30.0–36.0)
MCV: 81.7 fL (ref 80.0–100.0)
Monocytes Absolute: 0.6 10*3/uL (ref 0.1–1.0)
Monocytes Relative: 6 %
Neutro Abs: 6.2 10*3/uL (ref 1.7–7.7)
Neutrophils Relative %: 61 %
Platelets: 374 10*3/uL (ref 150–400)
RBC: 4.21 MIL/uL (ref 3.87–5.11)
RDW: 14.5 % (ref 11.5–15.5)
WBC: 10.1 10*3/uL (ref 4.0–10.5)
nRBC: 0 % (ref 0.0–0.2)

## 2022-09-27 LAB — COMPREHENSIVE METABOLIC PANEL
ALT: 14 U/L (ref 0–44)
AST: 17 U/L (ref 15–41)
Albumin: 3.5 g/dL (ref 3.5–5.0)
Alkaline Phosphatase: 48 U/L (ref 38–126)
Anion gap: 8 (ref 5–15)
BUN: 8 mg/dL (ref 6–20)
CO2: 26 mmol/L (ref 22–32)
Calcium: 8.6 mg/dL — ABNORMAL LOW (ref 8.9–10.3)
Chloride: 104 mmol/L (ref 98–111)
Creatinine, Ser: 0.8 mg/dL (ref 0.44–1.00)
GFR, Estimated: >60 mL/min (ref >60)
Glucose, Bld: 94 mg/dL (ref 70–99)
Potassium: 3.7 mmol/L (ref 3.5–5.1)
Sodium: 138 mmol/L (ref 135–145)
Total Bilirubin: 0.7 mg/dL (ref 0.3–1.2)
Total Protein: 7.4 g/dL (ref 6.5–8.1)

## 2022-09-27 LAB — URINALYSIS, ROUTINE W REFLEX MICROSCOPIC

## 2022-09-27 LAB — URINALYSIS, MICROSCOPIC (REFLEX)
Bacteria, UA: NONE SEEN
RBC / HPF: >50 RBC/hpf (ref 0–5)

## 2022-09-27 LAB — LIPASE, BLOOD: Lipase: 26 U/L (ref 11–51)

## 2022-09-27 MED ORDER — HYDROMORPHONE HCL 1 MG/ML IJ SOLN
1.0000 mg | Freq: Once | INTRAMUSCULAR | Status: AC
  Administered 2022-09-27: 1 mg via INTRAMUSCULAR
  Filled 2022-09-27: qty 1

## 2022-09-27 MED ORDER — KETOROLAC TROMETHAMINE 15 MG/ML IJ SOLN: 15.0000 mg | Freq: Once | INTRAMUSCULAR | Status: DC

## 2022-09-27 MED ORDER — HYDROMORPHONE HCL 1 MG/ML IJ SOLN
1.0000 mg | Freq: Once | INTRAMUSCULAR | Status: AC
  Administered 2022-09-27: 1 mg via INTRAVENOUS
  Filled 2022-09-27: qty 1

## 2022-09-27 MED ORDER — ONDANSETRON HCL 4 MG/2ML IJ SOLN
4.0000 mg | Freq: Once | INTRAMUSCULAR | Status: AC
  Administered 2022-09-27: 4 mg via INTRAVENOUS
  Filled 2022-09-27: qty 2

## 2022-09-27 MED ORDER — SODIUM CHLORIDE 0.9 % IV BOLUS
1000.0000 mL | Freq: Once | INTRAVENOUS | Status: AC
  Administered 2022-09-27: 1000 mL via INTRAVENOUS

## 2022-09-27 NOTE — ED Triage Notes (Signed)
Patient stated she is having lower back pain and hematuria. She thinks her lupus is acting up.

## 2022-09-27 NOTE — Discharge Instructions (Addendum)
Please go to your primary care appointment on the 28th.  Please discuss your ongoing symptoms with them and they may be able to provide a referral to the rheumatologist and Gastroenterologist to manage your lupus flareups and IBS.   You may use heat on the area to help with pain.   You may take up to 1000mg  of tylenol every 6 hours as needed for pain.  Return to the ER if you develop any chest pain, shortness of breath, severe abdominal pain, uncontrolled nausea, any other symptoms that are concerning for you.

## 2022-09-27 NOTE — ED Provider Notes (Signed)
[place] [facility] Provider Note   CSN: [id number] Arrival date & time: 09/27/22  1728     History  Chief Complaint  Patient presents with   Back Pain   Hematuria    Amy Mathis is a 42 y.o. female who presents with concern for epigastric pain that started this morning and hematuria.  She denies any vomiting, nausea, changes in bowel or bladder habits, fever or chills.  She feels this is consistent with her lupus flare ups.  She is well-known to the ER for this chief complaint, having been seen 5 times in the last month and 4 CT scans in the past year.  She has had difficulty getting into a primary care office, however she reports she has a appointment later this month.   Back Pain Hematuria       Home Medications Prior to Admission medications   Medication Sig Start Date End Date Taking? Authorizing Provider  ciprofloxacin (CIPRO) 500 MG tablet Take 1 tablet (500 mg total) by mouth 2 (two) times daily. One po bid x 7 days 08/16/22   Molpus, John, MD  cyclobenzaprine (FLEXERIL) 10 MG tablet Take 1 tablet (10 mg total) by mouth 2 (two) times daily as needed for muscle spasms. 06/15/22   Sherian Maroon A, PA  diclofenac Sodium (VOLTAREN) 1 % GEL Apply 4 g topically 4 (four) times daily. Apply to affected areas 4 times daily as needed for pain. 09/24/21   Theadora Rama Scales, PA-C  fluconazole (DIFLUCAN) 150 MG tablet Take 1 tablet as needed for vaginal yeast infection.  May repeat in 3 days if symptoms persist. 08/16/22   Molpus, Jonny Ruiz, MD  hydroxychloroquine (PLAQUENIL) 200 MG tablet Take by mouth 2 (two) times daily.    [provider]  methocarbamol (ROBAXIN) 500 MG tablet Take 1 tablet (500 mg total) by mouth 2 (two) times daily. 05/11/22   Jeannie Fend, PA-C  oxyCODONE (ROXICODONE) 5 MG immediate release tablet Take 1 tablet (5 mg total) by mouth every 8 (eight) hours as needed for up to 10 doses for severe pain. 08/31/22    Rancour, Jeannett Senior, MD  pantoprazole (PROTONIX) 20 MG tablet Take 2 tablets (40 mg total) by mouth daily. 09/16/22   Alvira Monday, MD  predniSONE (DELTASONE) 50 MG tablet Take 1 tablet (50 mg total) by mouth daily with breakfast. 07/20/22   Charlynne Pander, MD  promethazine (PHENERGAN) 25 MG tablet Take 1 tablet (25 mg total) by mouth every 6 (six) hours as needed for nausea or vomiting. 09/14/22   Renne Crigler, PA-C      Allergies    Morphine, Morphine and codeine, Penicillin g, Penicillins, Tramadol, Meperidine hcl, and Nsaids    Review of Systems   Review of Systems  Genitourinary:  Positive for hematuria.  Musculoskeletal:  Positive for back pain.    Physical Exam Updated Vital Signs BP 126/62   Pulse 73   Temp 98.9 F (37.2 C) (Oral)   Resp 20   Ht 5\' 10"  (1.778 m)   Wt (!) 147.9 kg   SpO2 100%   BMI 46.78 kg/m  Physical Exam Vitals and nursing note reviewed.  Constitutional:      General: She is not in acute distress.    Appearance: She is obese.  Eyes:     Conjunctiva/sclera: Conjunctivae normal.  Cardiovascular:     Rate and Rhythm: Normal rate and regular rhythm.  Pulmonary:     Effort: Pulmonary effort is  normal.     Breath sounds: Normal breath sounds.  Abdominal:     General: Abdomen is flat.     Palpations: Abdomen is soft.     Tenderness: There is abdominal tenderness. There is no right CVA tenderness, left CVA tenderness or guarding.  Skin:    General: Skin is warm and dry.  Neurological:     General: No focal deficit present.     Mental Status: She is alert.  Psychiatric:     Comments: Anxious appearing     ED Results / Procedures / Treatments   Labs (all labs ordered are listed, but only abnormal results are displayed) Labs Reviewed  URINALYSIS, ROUTINE W REFLEX MICROSCOPIC - Abnormal; Notable for the following components:      Result Value   Color, Urine RED (*)    APPearance TURBID (*)    Glucose, UA   (*)    Value: TEST NOT REPORTED  DUE TO COLOR INTERFERENCE OF URINE PIGMENT   Hgb urine dipstick   (*)    Value: TEST NOT REPORTED DUE TO COLOR INTERFERENCE OF URINE PIGMENT   Bilirubin Urine   (*)    Value: TEST NOT REPORTED DUE TO COLOR INTERFERENCE OF URINE PIGMENT   Ketones, ur   (*)    Value: TEST NOT REPORTED DUE TO COLOR INTERFERENCE OF URINE PIGMENT   Protein, ur   (*)    Value: TEST NOT REPORTED DUE TO COLOR INTERFERENCE OF URINE PIGMENT   Nitrite   (*)    Value: TEST NOT REPORTED DUE TO COLOR INTERFERENCE OF URINE PIGMENT   Leukocytes,Ua   (*)    Value: TEST NOT REPORTED DUE TO COLOR INTERFERENCE OF URINE PIGMENT   All other components within normal limits  CBC WITH DIFFERENTIAL/PLATELET - Abnormal; Notable for the following components:   HCT 34.4 (*)    All other components within normal limits  COMPREHENSIVE METABOLIC PANEL - Abnormal; Notable for the following components:   Calcium 8.6 (*)    All other components within normal limits  LIPASE, BLOOD  URINALYSIS, MICROSCOPIC (REFLEX)  CBC WITH DIFFERENTIAL/PLATELET    EKG None  Radiology No results found.  Procedures Procedures    Medications Ordered in ED Medications  sodium chloride 0.9 % bolus 1,000 mL (0 mLs Intravenous Stopped 09/27/22 2317)  ondansetron (ZOFRAN) injection 4 mg (4 mg Intravenous Given 09/27/22 2214)  HYDROmorphone (DILAUDID) injection 1 mg (1 mg Intravenous Given 09/27/22 2214)  HYDROmorphone (DILAUDID) injection 1 mg (1 mg Intramuscular Given 09/27/22 2359)    ED Course/ Medical Decision Making/ A&P                             Medical Decision Making Amount and/or Complexity of Data Reviewed Labs: ordered.  Risk Prescription drug management.   42 y.o. female with pertinent past medical history of lupus, IBS presents to the ED for concern of hematuria and abdominal pain  Differential diagnosis includes but is not limited to lupus flare, nephrolithiasis, gastroenteritis, IBS  ED Course:  Patient with chronic  flareups of abdominal pain.  This episode is consistent with prior hospital visits where workups have been unremarkable.  She has epigastric tenderness to palpation on exam.  No CVA tenderness.  Does have gross hematuria on urine sample consistent with baseline, denies any dysuria. No changes in bowel or bladder habits, no fever or chills. Pain controled today with IV Dilaudid.  CBC with no leukocytosis,  less concern for a infection such as gastroenteritis.  CMP with no elevations in LFTs.  No elevation in creatinine.  Lipase within normal limits.  No concern for liver, gallbladder, or pancreatic etiology at this time.  Patient reports she has not had her IBS medications in about a month, this could be contributing to her pain.  Pain consistent with prior flares and lupus, no further workup indicated at this time.    Impression: Epigastric pain .  Hematuria  Disposition:  The patient was discharged home with instructions to her to her primary care appointment at the end of the month.  They will be able to get her referral to rheumatology and GI for further management of her lupus and IBS. Return precautions given.  Lab Tests: I Ordered, and personally interpreted labs.  The pertinent results include:   CBC, CMP, lipase within normal limits Urinalysis with large amount of red blood cells  Imaging Studies ordered: Not indicated   Cardiac Monitoring: / EKG: Not indicated  Consultations Obtained: Not indicated   Co morbidities that complicate the patient evaluation  Lupus, IBS  Social Determinants of Health:  Difficulty getting into PCP, Has not had IBS medications recently              Final Clinical Impression(s) / ED Diagnoses Final diagnoses:  Epigastric pain    Rx / DC Orders ED Discharge Orders     None         Arabella Merles, PA-C 09/28/22 0009    Rexford Maus, DO 09/30/22 [phone]

## 2022-09-27 NOTE — ED Notes (Signed)
Pt unable to urinated at this time.

## 2022-09-27 NOTE — ED Provider Notes (Incomplete)
[place] [facility] Provider Note   CSN: [id number] Arrival date & time: 09/27/22  1728     History {Add pertinent medical, surgical, social history, OB history to HPI:1} Chief Complaint  Patient presents with  . Back Pain  . Hematuria    Olivianna Halas is a 42 y.o. female who presents with concern for epigastric pain that started this morning and hematuria.  She denies any vomiting, nausea, changes in bowel or bladder habits, fever or chills.  She feels this is consistent with her lupus flare ups.  She is well-known to the ER for this chief complaint, having been seen 5 times in the last month and 4 CT scans in the past year.  She has had difficulty getting into a primary care office, however she reports she has a appointment later this month.   Back Pain Hematuria       Home Medications Prior to Admission medications   Medication Sig Start Date End Date Taking? Authorizing Provider  ciprofloxacin (CIPRO) 500 MG tablet Take 1 tablet (500 mg total) by mouth 2 (two) times daily. One po bid x 7 days 08/16/22   Molpus, John, MD  cyclobenzaprine (FLEXERIL) 10 MG tablet Take 1 tablet (10 mg total) by mouth 2 (two) times daily as needed for muscle spasms. 06/15/22   Sherian Maroon A, PA  diclofenac Sodium (VOLTAREN) 1 % GEL Apply 4 g topically 4 (four) times daily. Apply to affected areas 4 times daily as needed for pain. 09/24/21   Theadora Rama Scales, PA-C  fluconazole (DIFLUCAN) 150 MG tablet Take 1 tablet as needed for vaginal yeast infection.  May repeat in 3 days if symptoms persist. 08/16/22   Molpus, Jonny Ruiz, MD  hydroxychloroquine (PLAQUENIL) 200 MG tablet Take by mouth 2 (two) times daily.    [provider]  methocarbamol (ROBAXIN) 500 MG tablet Take 1 tablet (500 mg total) by mouth 2 (two) times daily. 05/11/22   Jeannie Fend, PA-C  oxyCODONE (ROXICODONE) 5 MG immediate release tablet Take 1 tablet (5 mg total) by mouth every 8  (eight) hours as needed for up to 10 doses for severe pain. 08/31/22   Rancour, Jeannett Senior, MD  pantoprazole (PROTONIX) 20 MG tablet Take 2 tablets (40 mg total) by mouth daily. 09/16/22   Alvira Monday, MD  predniSONE (DELTASONE) 50 MG tablet Take 1 tablet (50 mg total) by mouth daily with breakfast. 07/20/22   Charlynne Pander, MD  promethazine (PHENERGAN) 25 MG tablet Take 1 tablet (25 mg total) by mouth every 6 (six) hours as needed for nausea or vomiting. 09/14/22   Renne Crigler, PA-C      Allergies    Morphine, Morphine and codeine, Penicillin g, Penicillins, Tramadol, Meperidine hcl, and Nsaids    Review of Systems   Review of Systems  Genitourinary:  Positive for hematuria.  Musculoskeletal:  Positive for back pain.    Physical Exam Updated Vital Signs BP 126/62   Pulse 73   Temp 98.9 F (37.2 C) (Oral)   Resp 20   Ht 5\' 10"  (1.778 m)   Wt (!) 147.9 kg   SpO2 100%   BMI 46.78 kg/m  Physical Exam Vitals and nursing note reviewed.  Constitutional:      General: She is not in acute distress.    Appearance: She is obese.  Eyes:     Conjunctiva/sclera: Conjunctivae normal.  Cardiovascular:     Rate and Rhythm: Normal rate and regular rhythm.  Pulmonary:     Effort: Pulmonary effort is normal.     Breath sounds: Normal breath sounds.  Abdominal:     General: Abdomen is flat.     Palpations: Abdomen is soft.     Tenderness: There is abdominal tenderness. There is no right CVA tenderness, left CVA tenderness or guarding.  Skin:    General: Skin is warm and dry.  Neurological:     General: No focal deficit present.     Mental Status: She is alert.  Psychiatric:     Comments: Anxious appearing     ED Results / Procedures / Treatments   Labs (all labs ordered are listed, but only abnormal results are displayed) Labs Reviewed  CBC WITH DIFFERENTIAL/PLATELET - Abnormal; Notable for the following components:      Result Value   HCT 34.4 (*)    All other components  within normal limits  COMPREHENSIVE METABOLIC PANEL - Abnormal; Notable for the following components:   Calcium 8.6 (*)    All other components within normal limits  LIPASE, BLOOD  URINALYSIS, ROUTINE W REFLEX MICROSCOPIC  CBC WITH DIFFERENTIAL/PLATELET    EKG None  Radiology No results found.  Procedures Procedures  {Document cardiac monitor, telemetry assessment procedure when appropriate:1}  Medications Ordered in ED Medications  HYDROmorphone (DILAUDID) injection 1 mg (has no administration in time range)  sodium chloride 0.9 % bolus 1,000 mL (0 mLs Intravenous Stopped 09/27/22 2317)  ondansetron (ZOFRAN) injection 4 mg (4 mg Intravenous Given 09/27/22 2214)  HYDROmorphone (DILAUDID) injection 1 mg (1 mg Intravenous Given 09/27/22 2214)    ED Course/ Medical Decision Making/ A&P   {   Click here for ABCD2, HEART and other calculatorsREFRESH Note before signing :1}                          Medical Decision Making Amount and/or Complexity of Data Reviewed Labs: ordered.  Risk Prescription drug management.   42 y.o. female with pertinent past medical history of lupus, IBS presents to the ED for concern of hematuria and abdominal pain  Differential diagnosis includes but is not limited to lupus flare, nephrolithiasis, gastroenteritis, IBS  ED Course:  Patient with chronic flareups of abdominal pain.  This episode is consistent with prior hospital visits.  She has epigastric tenderness to palpation on exam.  No CVA tenderness.  Does have gross hematuria on urine sample  Upon re-evaluation, patient ***.    Impression: ***  Disposition:  {AF ED Dispo:29713} Return precautions given.  Lab Tests: I Ordered, and personally interpreted labs.  The pertinent results include:  ***  Imaging Studies ordered: I ordered imaging studies including ***  I independently visualized the imaging with scope of interpretation limited to determining acute life threatening conditions  related to emergency care. Imaging showed *** I agree with the radiologist interpretation   Cardiac Monitoring: / EKG: The patient was maintained on a cardiac monitor.  I personally viewed and interpreted the cardiac monitored which showed an underlying rhythm of: ***   Consultations Obtained: I requested consultation with the ***,  and discussed lab and imaging findings as well as pertinent plan - they recommend: ***Additional history obtained from ***  External records from outside source obtained and reviewed including ***   Co morbidities that complicate the patient evaluation  ***  Social Determinants of Health:  {ZOXW:96045}       {Document critical care time when appropriate:1} {Document review of labs and  clinical decision tools ie heart score, Chads2Vasc2 etc:1}  {Document your independent review of radiology images, and any outside records:1} {Document your discussion with family members, caretakers, and with consultants:1} {Document social determinants of health affecting pt's care:1} {Document your decision making why or why not admission, treatments were needed:1} Final Clinical Impression(s) / ED Diagnoses Final diagnoses:  Epigastric pain    Rx / DC Orders ED Discharge Orders     None

## 2022-10-03 ENCOUNTER — Emergency Department (HOSPITAL_BASED_OUTPATIENT_CLINIC_OR_DEPARTMENT_OTHER)
Admission: EM | Admit: 2022-10-03 | Discharge: 2022-10-03 | Disposition: A | Discharge status: Home / Self Care | Payer: Medicaid Other | Attending: Emergency Medicine | Admitting: Emergency Medicine

## 2022-10-03 ENCOUNTER — Encounter (HOSPITAL_BASED_OUTPATIENT_CLINIC_OR_DEPARTMENT_OTHER): Payer: Self-pay

## 2022-10-03 DIAGNOSIS — M545 Low back pain, unspecified: Secondary | ICD-10-CM | POA: Diagnosis not present

## 2022-10-03 DIAGNOSIS — R319 Hematuria, unspecified: Secondary | ICD-10-CM | POA: Diagnosis not present

## 2022-10-03 DIAGNOSIS — G8929 Other chronic pain: Secondary | ICD-10-CM

## 2022-10-03 DIAGNOSIS — R109 Unspecified abdominal pain: Secondary | ICD-10-CM | POA: Diagnosis not present

## 2022-10-03 MED ORDER — HYDROMORPHONE HCL 1 MG/ML IJ SOLN
2.0000 mg | Freq: Once | INTRAMUSCULAR | Status: AC
  Administered 2022-10-03: 2 mg via INTRAMUSCULAR
  Filled 2022-10-03: qty 2

## 2022-10-03 MED ORDER — OXYCODONE HCL 5 MG PO TABS: 5.0000 mg | ORAL_TABLET | Freq: Three times a day (TID) | ORAL | 0 refills | Status: DC | PRN

## 2022-10-03 NOTE — ED Provider Notes (Signed)
[place] [facility] Provider Note   CSN: [id number] Arrival date & time: 10/03/22  1548     History  Chief Complaint  Patient presents with   Back Pain    R   Hematuria    Amy Mathis is a 42 y.o. female.  Patient with history of IBS, SLE with chronic right flank pain and hematuria presents today with complaints of right flank pain.  She used to be on 50 mg of prednisone and Plaquenil daily for her lupus but lost her insurance and therefore her PCP as well and subsequently ran out of this medication for the first year.  She states that she has had difficulty establishing pcp care here since moving here from [place] at the beginning of the year.  She states she tried to get an appointment to the office is upstairs but they do not accept her insurance.  She has been able to get an appointment through [facility] on 7/28, 10 days from today.  She states that her pain is unchanged from her previous visits, however she is out of her chronic pain medication which is only thing that helps with her symptoms.  She will presents requesting pain medication to hold her over until she can see her primary doctor on the 28th.  She denies any fevers, chills, chest pain, shortness of breath, nausea, vomiting, diarrhea, abdominal pain.  She notes chronic hematuria, denies dysuria.  The history is provided by the patient. No language interpreter was used.  Back Pain Hematuria       Home Medications Prior to Admission medications   Medication Sig Start Date End Date Taking? Authorizing Provider  ciprofloxacin (CIPRO) 500 MG tablet Take 1 tablet (500 mg total) by mouth 2 (two) times daily. One po bid x 7 days 08/16/22   Molpus, John, MD  cyclobenzaprine (FLEXERIL) 10 MG tablet Take 1 tablet (10 mg total) by mouth 2 (two) times daily as needed for muscle spasms. 06/15/22   Sherian Maroon A, PA  diclofenac Sodium (VOLTAREN) 1 % GEL Apply 4 g topically 4  (four) times daily. Apply to affected areas 4 times daily as needed for pain. 09/24/21   Theadora Rama Scales, PA-C  fluconazole (DIFLUCAN) 150 MG tablet Take 1 tablet as needed for vaginal yeast infection.  May repeat in 3 days if symptoms persist. 08/16/22   Molpus, Jonny Ruiz, MD  hydroxychloroquine (PLAQUENIL) 200 MG tablet Take by mouth 2 (two) times daily.    [provider]  methocarbamol (ROBAXIN) 500 MG tablet Take 1 tablet (500 mg total) by mouth 2 (two) times daily. 05/11/22   Jeannie Fend, PA-C  oxyCODONE (ROXICODONE) 5 MG immediate release tablet Take 1 tablet (5 mg total) by mouth every 8 (eight) hours as needed for up to 10 doses for severe pain. 08/31/22   Rancour, Jeannett Senior, MD  pantoprazole (PROTONIX) 20 MG tablet Take 2 tablets (40 mg total) by mouth daily. 09/16/22   Alvira Monday, MD  predniSONE (DELTASONE) 50 MG tablet Take 1 tablet (50 mg total) by mouth daily with breakfast. 07/20/22   Charlynne Pander, MD  promethazine (PHENERGAN) 25 MG tablet Take 1 tablet (25 mg total) by mouth every 6 (six) hours as needed for nausea or vomiting. 09/14/22   Renne Crigler, PA-C      Allergies    Morphine, Morphine and codeine, Penicillin g, Penicillins, Tramadol, Meperidine hcl, and Nsaids    Review of Systems   Review of  Systems  Genitourinary:  Positive for flank pain and hematuria.  All other systems reviewed and are negative.   Physical Exam Updated Vital Signs BP (!) 150/109   Pulse (!) 109   Temp 98.2 F (36.8 C)   SpO2 99%  Physical Exam Vitals and nursing note reviewed.  Constitutional:      General: She is not in acute distress.    Appearance: Normal appearance. She is obese. She is not ill-appearing, toxic-appearing or diaphoretic.  HENT:     Head: Normocephalic and atraumatic.  Cardiovascular:     Rate and Rhythm: Normal rate.  Pulmonary:     Effort: Pulmonary effort is normal. No respiratory distress.  Abdominal:     General: Abdomen is flat.      Palpations: Abdomen is soft.     Tenderness: There is no abdominal tenderness.  Musculoskeletal:        General: Normal range of motion.     Cervical back: Normal range of motion.  Skin:    General: Skin is warm and dry.  Neurological:     General: No focal deficit present.     Mental Status: She is alert.  Psychiatric:        Mood and Affect: Mood normal.        Behavior: Behavior normal.     ED Results / Procedures / Treatments   Labs (all labs ordered are listed, but only abnormal results are displayed) Labs Reviewed - No data to display  EKG None  Radiology No results found.  Procedures Procedures    Medications Ordered in ED Medications  HYDROmorphone (DILAUDID) injection 2 mg (has no administration in time range)    ED Course/ Medical Decision Making/ A&P                             Medical Decision Making Risk Prescription drug management.   Patient presents today with complaints of chronic right flank pain.  She is afebrile, nontoxic-appearing, in no acute distress with reassuring vital signs.  Chart reviewed, patient has had 20 ER visits in the last 6 months with similar symptoms.  She has had 4 CT scans so far this year which have been unremarkable. Last scan was 6/14 that showed punctate stone in the left kidney without any other changes or concerning findings.  Her pain is all right-sided.  She has had numerous evaluations with labs that have been unremarkable, her kidney function is stable and always WNL.  Last labs were performed 6 days ago.  She always has gross hematuria on her UA as well.  Shared decision making, patient would prefer to defer additional evaluation with labs or imaging at this time given her numerous benign workups.  She also has an appointment with her PCP in 10 days.  Will give a dose of pain medication per her request for pain control and give her a few doses of oxycodone to hold her over until her appointment.  PDMP reviewed, patient  advised not to drive or operate heavy machinery while taking this medication.  Evaluation and diagnostic testing in the emergency department does not suggest an emergent condition requiring admission or immediate intervention beyond what has been performed at this time.  Plan for discharge with close PCP follow-up.  Patient is understanding and amenable with plan, educated on red flag symptoms that would prompt immediate return.  Patient discharged in stable condition.   Final Clinical Impression(s) / ED  Diagnoses Final diagnoses:  Chronic flank pain    Rx / DC Orders ED Discharge Orders          Ordered    oxyCODONE (ROXICODONE) 5 MG immediate release tablet  Every 8 hours PRN        10/03/22 1640          An After Visit Summary was printed and given to the patient.     Vear Clock 10/03/22 1642    Rolan Bucco, MD 10/03/22 [phone]

## 2022-10-03 NOTE — Discharge Instructions (Addendum)
As we discussed, given you have had numerous ED workups in the last few months that have been normal, there is no indication to repeat labs or imaging today.  It is imperative that you see a primary doctor at your earliest convenience to manage these chronic symptoms and to get you established with pain management and rheumatology.  I have given you a few doses of narcotic pain medication to hold you over until you can see a primary doctor.  Please take these as prescribed as needed for severe pain only.  Do not drive or operate heavy machinery while taking this medication as they can be sedating.  Return if development of any new or worsening symptoms.

## 2022-10-03 NOTE — ED Triage Notes (Signed)
Pt c/o R sided "kidney pain" x2wks- "hasn't let up, been getting worse. Associated hematuria for the same period of time. Endorses hx kidney stones, lupus. Tylenol for pain, no help, last dose 1130

## 2022-10-03 NOTE — ED Notes (Signed)
Discharge paperwork reviewed entirely with patient, including follow up care. Pain was under control. The patient received instruction and coaching on their prescriptions, and all follow-up questions were answered.  Pt verbalized understanding as well as all parties involved. No questions or concerns voiced at the time of discharge. No acute distress noted.   Pt ambulated out to PVA without incident or assistance.  

## 2022-10-16 ENCOUNTER — Ambulatory Visit: Payer: Medicaid Other | Admitting: Family Medicine

## 2022-10-16 DIAGNOSIS — Z0000 Encounter for general adult medical examination without abnormal findings: Secondary | ICD-10-CM

## 2022-11-20 ENCOUNTER — Emergency Department (HOSPITAL_BASED_OUTPATIENT_CLINIC_OR_DEPARTMENT_OTHER)
Admission: EM | Admit: 2022-11-20 | Discharge: 2022-11-20 | Disposition: A | Discharge status: Home / Self Care | Payer: Medicaid Other | Attending: Emergency Medicine | Admitting: Emergency Medicine

## 2022-11-20 ENCOUNTER — Other Ambulatory Visit: Payer: Self-pay

## 2022-11-20 ENCOUNTER — Encounter (HOSPITAL_BASED_OUTPATIENT_CLINIC_OR_DEPARTMENT_OTHER): Payer: Self-pay | Admitting: Pediatrics

## 2022-11-20 DIAGNOSIS — R109 Unspecified abdominal pain: Secondary | ICD-10-CM | POA: Insufficient documentation

## 2022-11-20 DIAGNOSIS — R310 Gross hematuria: Secondary | ICD-10-CM | POA: Diagnosis not present

## 2022-11-20 LAB — COMPREHENSIVE METABOLIC PANEL WITH GFR
ALT: 18 U/L (ref 0–44)
AST: 21 U/L (ref 15–41)
Albumin: 4 g/dL (ref 3.5–5.0)
Alkaline Phosphatase: 53 U/L (ref 38–126)
Anion gap: 10 (ref 5–15)
BUN: 7 mg/dL (ref 6–20)
CO2: 26 mmol/L (ref 22–32)
Calcium: 9.2 mg/dL (ref 8.9–10.3)
Chloride: 101 mmol/L (ref 98–111)
Creatinine, Ser: 0.72 mg/dL (ref 0.44–1.00)
GFR, Estimated: >60 mL/min
Glucose, Bld: 84 mg/dL (ref 70–99)
Potassium: 3.5 mmol/L (ref 3.5–5.1)
Sodium: 137 mmol/L (ref 135–145)
Total Bilirubin: 0.3 mg/dL (ref 0.3–1.2)
Total Protein: 8.6 g/dL — ABNORMAL HIGH (ref 6.5–8.1)

## 2022-11-20 LAB — CBC
HCT: 37.5 % (ref 36.0–46.0)
Hemoglobin: 13.3 g/dL (ref 12.0–15.0)
MCH: 29.3 pg (ref 26.0–34.0)
MCHC: 35.5 g/dL (ref 30.0–36.0)
MCV: 82.6 fL (ref 80.0–100.0)
Platelets: 349 10*3/uL (ref 150–400)
RBC: 4.54 MIL/uL (ref 3.87–5.11)
RDW: 14.3 % (ref 11.5–15.5)
WBC: 8.7 10*3/uL (ref 4.0–10.5)
nRBC: 0 % (ref 0.0–0.2)

## 2022-11-20 LAB — URINALYSIS, MICROSCOPIC (REFLEX): RBC / HPF: >50 RBC/hpf (ref 0–5)

## 2022-11-20 NOTE — ED Triage Notes (Signed)
C/O "lupus flare"; states she's having bilateral flank pain and ankle, wrist and elbow pain.

## 2022-11-20 NOTE — ED Provider Notes (Signed)
[place] [facility] Provider Note   CSN: [id number] Arrival date & time: 11/20/22  1001     History  Chief Complaint  Patient presents with   Flank Pain    Amy Mathis is a 42 y.o. female with history of IBS, SLE, chronic flank pain, presents with concern for bilateral flank pain but worse on the right side.  This recent "flareup" started about 4 days ago.  Also reports multiple months of hematuria, no dysuria or increased frequency.  Denies any fever or chills, abdominal pain, nausea or vomiting, changes in her bowel or bladder habits.  Has been seen by her primary who is working to get her into urology.  She is currently on 7.5 mg oxycodone twice daily for pain.  She also reports being on Phenergan for nausea.  Of note, she has been seen multiple times in the ER for the similar complaint over the last couple months.   HPI     Home Medications Prior to Admission medications   Medication Sig Start Date End Date Taking? Authorizing Provider  hydroxychloroquine (PLAQUENIL) 200 MG tablet Take by mouth 2 (two) times daily.    [provider]  oxyCODONE (ROXICODONE) 5 MG immediate release tablet Take 1 tablet (5 mg total) by mouth every 8 (eight) hours as needed for up to 10 doses for severe pain. 10/03/22   Smoot, Shawn Route, PA-C  pantoprazole (PROTONIX) 20 MG tablet Take 2 tablets (40 mg total) by mouth daily. 09/16/22   Alvira Monday, MD  promethazine (PHENERGAN) 25 MG tablet Take 1 tablet (25 mg total) by mouth every 6 (six) hours as needed for nausea or vomiting. 09/14/22   Renne Crigler, PA-C      Allergies    Morphine, Morphine and codeine, Penicillin g, Penicillins, Tramadol, Meperidine hcl, and Nsaids    Review of Systems   Review of Systems  Genitourinary:  Positive for flank pain and hematuria.    Physical Exam Updated Vital Signs BP (!) 146/79   Pulse 80   Temp 97.8 F (36.6 C)   Resp 17   Ht 5\' 8"  (1.727 m)   Wt (!)  147.9 kg   SpO2 100%   BMI 49.57 kg/m  Physical Exam Vitals and nursing note reviewed.  Constitutional:      General: She is not in acute distress.    Appearance: She is well-developed.  HENT:     Head: Normocephalic and atraumatic.  Eyes:     Conjunctiva/sclera: Conjunctivae normal.  Cardiovascular:     Rate and Rhythm: Normal rate and regular rhythm.     Heart sounds: No murmur heard. Pulmonary:     Effort: Pulmonary effort is normal. No respiratory distress.     Breath sounds: Normal breath sounds.  Abdominal:     Palpations: Abdomen is soft.     Tenderness: There is no abdominal tenderness.     Comments: No CVA tenderness on the right or left side  Musculoskeletal:        General: No swelling.     Cervical back: Neck supple.  Skin:    General: Skin is warm and dry.     Capillary Refill: Capillary refill takes less than 2 seconds.  Neurological:     Mental Status: She is alert.  Psychiatric:        Mood and Affect: Mood normal.     ED Results / Procedures / Treatments   Labs (all labs ordered are listed, but  only abnormal results are displayed) Labs Reviewed  COMPREHENSIVE METABOLIC PANEL - Abnormal; Notable for the following components:      Result Value   Total Protein 8.6 (*)    All other components within normal limits  URINALYSIS, ROUTINE W REFLEX MICROSCOPIC - Abnormal; Notable for the following components:   Color, Urine RED (*)    APPearance TURBID (*)    pH 8.5 (*)    Glucose, UA   (*)    Value: TEST NOT REPORTED DUE TO COLOR INTERFERENCE OF URINE PIGMENT   Hgb urine dipstick   (*)    Value: TEST NOT REPORTED DUE TO COLOR INTERFERENCE OF URINE PIGMENT   Bilirubin Urine   (*)    Value: TEST NOT REPORTED DUE TO COLOR INTERFERENCE OF URINE PIGMENT   Ketones, ur   (*)    Value: TEST NOT REPORTED DUE TO COLOR INTERFERENCE OF URINE PIGMENT   Protein, ur   (*)    Value: TEST NOT REPORTED DUE TO COLOR INTERFERENCE OF URINE PIGMENT   Nitrite   (*)     Value: TEST NOT REPORTED DUE TO COLOR INTERFERENCE OF URINE PIGMENT   Leukocytes,Ua   (*)    Value: TEST NOT REPORTED DUE TO COLOR INTERFERENCE OF URINE PIGMENT   All other components within normal limits  URINALYSIS, MICROSCOPIC (REFLEX) - Abnormal; Notable for the following components:   Bacteria, UA RARE (*)    All other components within normal limits  CBC    EKG None  Radiology No results found.  Procedures Procedures    Medications Ordered in ED Medications - No data to display  ED Course/ Medical Decision Making/ A&P                                 Medical Decision Making Amount and/or Complexity of Data Reviewed Labs: ordered.   42 y.o. female with pertinent past medical history of IBS, SLE, chronic hematuria and flank pain presents to the ED for concern of flareup of right flank pain  Differential diagnosis includes but is not limited to nephrolithiasis, pyelonephritis, musculoskeletal lower back pain, SLE  ED Course:  Patient presents with concern for flareup of her right flank pain.  This has been ongoing for months and she has been seen multiple times in the ER for the same complaint.  She reports this is the same as the pain she has been having.  Denies any fever or chills, dysuria, increased frequency.  CBC without any leukocytosis.  No CVA tenderness bilaterally.  No concern for pyelonephritis or UTI at this time.  CBC and CMP unremarkable.  Urinalysis does show gross hematuria but this is consistent with urinalysis performed at the last several visits.  No CVA tenderness, flank pain is her baseline and has been going on for multiple months, low suspicion for nephrolithiasis and no acute changes indicating further imaging. She reports she has a PCP follow-up this afternoon who is currently working to get her into urology per the patient.  I feel she is stable and it is appropriate for her to follow-up with her PCP as she will be able to coordinate follow-up with  urology and further management of this chronic problem.   Impression: Chronic right flank pain Chronic hematuria  Disposition:  The patient was discharged home with instructions to follow-up with PCP this afternoon for further evaluation and for referral to get into urology. Return precautions  given.  Lab Tests: I Ordered, and personally interpreted labs.  The pertinent results include:   CBC without leukocytosis CMP unremarkable Urinalysis with gross hematuria  External records from outside source obtained and reviewed including prior ER visits from 7/18, 7/12   Co morbidities that complicate the patient evaluation  IBS, SLE, chronic hematuria and flank pain             Final Clinical Impression(s) / ED Diagnoses Final diagnoses:  Gross hematuria  Right flank pain    Rx / DC Orders ED Discharge Orders     None         Arabella Merles, PA-C 11/20/22 1222    Alvira Monday, MD 11/22/22 [phone]

## 2022-11-20 NOTE — Discharge Instructions (Addendum)
Overall, your lab work is reassuring and consistent with prior visits.  Please follow-up with your PCP appointment later today at 2 PM.  Return the ER if you have uncontrolled nausea or vomiting, unexplained fever or chills, any other new or concerning symptoms.

## 2022-11-27 LAB — URINALYSIS, ROUTINE W REFLEX MICROSCOPIC

## 2022-12-06 ENCOUNTER — Encounter (HOSPITAL_BASED_OUTPATIENT_CLINIC_OR_DEPARTMENT_OTHER): Payer: Self-pay | Admitting: Emergency Medicine

## 2022-12-06 ENCOUNTER — Emergency Department (HOSPITAL_BASED_OUTPATIENT_CLINIC_OR_DEPARTMENT_OTHER)
Admission: EM | Admit: 2022-12-06 | Discharge: 2022-12-06 | Disposition: A | Discharge status: Home / Self Care | Payer: Medicaid Other | Attending: Emergency Medicine | Admitting: Emergency Medicine

## 2022-12-06 ENCOUNTER — Other Ambulatory Visit: Payer: Self-pay

## 2022-12-06 DIAGNOSIS — B029 Zoster without complications: Secondary | ICD-10-CM | POA: Diagnosis not present

## 2022-12-06 DIAGNOSIS — R21 Rash and other nonspecific skin eruption: Secondary | ICD-10-CM | POA: Diagnosis present

## 2022-12-06 MED ORDER — VALACYCLOVIR HCL 1 G PO TABS: 1000.0000 mg | ORAL_TABLET | Freq: Three times a day (TID) | ORAL | 0 refills | Status: DC

## 2022-12-06 MED ORDER — GABAPENTIN 100 MG PO CAPS: 100.0000 mg | ORAL_CAPSULE | Freq: Three times a day (TID) | ORAL | 0 refills | Status: DC

## 2022-12-06 MED ORDER — OXYCODONE HCL 5 MG PO TABS: 2.5000 mg | ORAL_TABLET | ORAL | 0 refills | Status: DC | PRN

## 2022-12-06 NOTE — ED Provider Notes (Signed)
[place] [facility] Provider Note   CSN: [id number] Arrival date & time: 12/06/22  7829     History  Chief Complaint  Patient presents with   Rash    Amy Mathis is a 42 y.o. female with a past medical history of lupus who presents emergency department chief complaint of rash on the left arm.  Patient has 3 days of onset of symptoms.  She began having burning pain in the left arm followed by itching followed by development of vesicular rash on the left arm.  She denies fevers or chills.  She went to [facility] pharmacy and asked if she could get a cream for her rash.  Pharmacist expressed concern for shingles and she came here for further evaluation.  She denies fever or chills.   Rash      Home Medications Prior to Admission medications   Medication Sig Start Date End Date Taking? Authorizing Provider  hydroxychloroquine (PLAQUENIL) 200 MG tablet Take by mouth 2 (two) times daily.    [provider]  oxyCODONE (ROXICODONE) 5 MG immediate release tablet Take 1 tablet (5 mg total) by mouth every 8 (eight) hours as needed for up to 10 doses for severe pain. 10/03/22   Smoot, Shawn Route, PA-C  pantoprazole (PROTONIX) 20 MG tablet Take 2 tablets (40 mg total) by mouth daily. 09/16/22   Alvira Monday, MD  promethazine (PHENERGAN) 25 MG tablet Take 1 tablet (25 mg total) by mouth every 6 (six) hours as needed for nausea or vomiting. 09/14/22   Renne Crigler, PA-C      Allergies    Morphine, Morphine and codeine, Penicillin g, Penicillins, Tramadol, Meperidine hcl, and Nsaids    Review of Systems   Review of Systems  Skin:  Positive for rash.    Physical Exam Updated Vital Signs BP (!) 143/93 (BP Location: Right Arm)   Pulse 95   Temp 98.2 F (36.8 C) (Oral)   Resp 20   Ht 5\' 8"  (1.727 m)   Wt (!) 156.9 kg   SpO2 99%   BMI 52.61 kg/m  Physical Exam Vitals and nursing note reviewed.  Constitutional:      General: She is not in  acute distress.    Appearance: She is well-developed. She is not diaphoretic.  HENT:     Head: Normocephalic and atraumatic.     Right Ear: External ear normal.     Left Ear: External ear normal.     Nose: Nose normal.     Mouth/Throat:     Mouth: Mucous membranes are moist.  Eyes:     General: No scleral icterus.    Conjunctiva/sclera: Conjunctivae normal.  Cardiovascular:     Rate and Rhythm: Normal rate and regular rhythm.     Heart sounds: Normal heart sounds. No murmur heard.    No friction rub. No gallop.  Pulmonary:     Effort: Pulmonary effort is normal. No respiratory distress.     Breath sounds: Normal breath sounds.  Abdominal:     General: Bowel sounds are normal. There is no distension.     Palpations: Abdomen is soft. There is no mass.     Tenderness: There is no abdominal tenderness. There is no guarding.  Musculoskeletal:     Cervical back: Normal range of motion.  Skin:    General: Skin is warm and dry.     Comments: Patient appears to have vesicular rash on the left arm and 1 or  2 dermatomal patterns.  Likely early shingles  Neurological:     Mental Status: She is alert and oriented to person, place, and time.  Psychiatric:        Behavior: Behavior normal.     ED Results / Procedures / Treatments   Labs (all labs ordered are listed, but only abnormal results are displayed) Labs Reviewed - No data to display  EKG None  Radiology No results found.  Procedures Procedures    Medications Ordered in ED Medications - No data to display  ED Course/ Medical Decision Making/ A&P                                 Medical Decision Making Patient here with rash on the left arm, burning pain in the left arm.  Suspect early shingles.  She does not appear to have any secondary infections.  Doubt other causes of rash such as contact dermatitis, bullous impetigo.  PDMP reviewed during this visit.  Will treat with Valtrex, gabapentin, pain medication,  outpatient follow-up and return precautions discussed.  Discussed isolation and contact precautions.  Appropriate for discharge at this time           Final Clinical Impression(s) / ED Diagnoses Final diagnoses:  Herpes zoster without complication    Rx / DC Orders ED Discharge Orders     None         Arthor Captain, PA-C 12/06/22 1218    Loetta Rough, MD 12/06/22 1244

## 2022-12-06 NOTE — Discharge Instructions (Signed)
Contact a health care provider if: Your pain doesn't get better with medicine. Your pain doesn't get better after the rash heals. You have any signs of infection around the rash. Your rash or blisters get worse. You have a fever or chills. Get help right away if: The rash is on your face or nose. You have pain in your face or by your eye. You lose feeling on one side of your face. You have trouble seeing. You have ear pain or ringing in your ear.

## 2022-12-06 NOTE — ED Triage Notes (Addendum)
Patient presents to ED via POV from home. Here with 3 day history of rash to left forearm. Reports she went to the pharmacy for cream and the pharmacist expressed concerns for shingles. Reports a burning pain.

## 2023-01-02 ENCOUNTER — Emergency Department (HOSPITAL_BASED_OUTPATIENT_CLINIC_OR_DEPARTMENT_OTHER): Payer: Medicaid Other

## 2023-01-02 ENCOUNTER — Encounter (HOSPITAL_BASED_OUTPATIENT_CLINIC_OR_DEPARTMENT_OTHER): Payer: Self-pay | Admitting: Emergency Medicine

## 2023-01-02 ENCOUNTER — Other Ambulatory Visit: Payer: Self-pay

## 2023-01-02 ENCOUNTER — Emergency Department (HOSPITAL_BASED_OUTPATIENT_CLINIC_OR_DEPARTMENT_OTHER)
Admission: EM | Admit: 2023-01-02 | Discharge: 2023-01-02 | Disposition: A | Discharge status: Home / Self Care | Payer: Medicaid Other | Attending: Emergency Medicine | Admitting: Emergency Medicine

## 2023-01-02 DIAGNOSIS — G8929 Other chronic pain: Secondary | ICD-10-CM | POA: Insufficient documentation

## 2023-01-02 DIAGNOSIS — M3210 Systemic lupus erythematosus, organ or system involvement unspecified: Secondary | ICD-10-CM | POA: Diagnosis not present

## 2023-01-02 DIAGNOSIS — R109 Unspecified abdominal pain: Secondary | ICD-10-CM | POA: Insufficient documentation

## 2023-01-02 DIAGNOSIS — M329 Systemic lupus erythematosus, unspecified: Secondary | ICD-10-CM

## 2023-01-02 LAB — CBC WITH DIFFERENTIAL/PLATELET
Abs Immature Granulocytes: 0.03 10*3/uL (ref 0.00–0.07)
Basophils Absolute: 0.1 10*3/uL (ref 0.0–0.1)
Basophils Relative: 1 %
Eosinophils Absolute: 0.3 10*3/uL (ref 0.0–0.5)
Eosinophils Relative: 3 %
HCT: 38.3 % (ref 36.0–46.0)
Hemoglobin: 13.7 g/dL (ref 12.0–15.0)
Immature Granulocytes: 0 %
Lymphocytes Relative: 30 %
Lymphs Abs: 3.2 10*3/uL (ref 0.7–4.0)
MCH: 29.4 pg (ref 26.0–34.0)
MCHC: 35.8 g/dL (ref 30.0–36.0)
MCV: 82.2 fL (ref 80.0–100.0)
Monocytes Absolute: 0.6 10*3/uL (ref 0.1–1.0)
Monocytes Relative: 5 %
Neutro Abs: 6.8 10*3/uL (ref 1.7–7.7)
Neutrophils Relative %: 61 %
Platelets: 392 10*3/uL (ref 150–400)
RBC: 4.66 MIL/uL (ref 3.87–5.11)
RDW: 14.4 % (ref 11.5–15.5)
WBC: 11 10*3/uL — ABNORMAL HIGH (ref 4.0–10.5)
nRBC: 0 % (ref 0.0–0.2)

## 2023-01-02 LAB — URINALYSIS, W/ REFLEX TO CULTURE (INFECTION SUSPECTED): RBC / HPF: >50 RBC/hpf (ref 0–5)

## 2023-01-02 LAB — COMPREHENSIVE METABOLIC PANEL
ALT: 19 U/L (ref 0–44)
AST: 23 U/L (ref 15–41)
Albumin: 3.9 g/dL (ref 3.5–5.0)
Alkaline Phosphatase: 61 U/L (ref 38–126)
Anion gap: 13 (ref 5–15)
BUN: 9 mg/dL (ref 6–20)
CO2: 25 mmol/L (ref 22–32)
Calcium: 9.2 mg/dL (ref 8.9–10.3)
Chloride: 99 mmol/L (ref 98–111)
Creatinine, Ser: 0.81 mg/dL (ref 0.44–1.00)
GFR, Estimated: >60 mL/min (ref >60)
Glucose, Bld: 100 mg/dL — ABNORMAL HIGH (ref 70–99)
Potassium: 4.2 mmol/L (ref 3.5–5.1)
Sodium: 137 mmol/L (ref 135–145)
Total Bilirubin: 0.7 mg/dL (ref 0.3–1.2)
Total Protein: 8.7 g/dL — ABNORMAL HIGH (ref 6.5–8.1)

## 2023-01-02 MED ORDER — HYDROMORPHONE HCL 1 MG/ML IJ SOLN
1.0000 mg | Freq: Once | INTRAMUSCULAR | Status: AC
  Administered 2023-01-02: 1 mg via INTRAVENOUS
  Filled 2023-01-02: qty 1

## 2023-01-02 MED ORDER — DEXAMETHASONE SODIUM PHOSPHATE 10 MG/ML IJ SOLN
10.0000 mg | Freq: Once | INTRAMUSCULAR | Status: DC
  Filled 2023-01-02: qty 1

## 2023-01-02 MED ORDER — DEXAMETHASONE SODIUM PHOSPHATE 10 MG/ML IJ SOLN
10.0000 mg | Freq: Once | INTRAMUSCULAR | Status: AC
  Administered 2023-01-02: 10 mg via INTRAVENOUS

## 2023-01-02 MED ORDER — IOHEXOL 300 MG/ML  SOLN
125.0000 mL | Freq: Once | INTRAMUSCULAR | Status: AC | PRN
  Administered 2023-01-02: 125 mL via INTRAVENOUS

## 2023-01-02 NOTE — Discharge Instructions (Addendum)
As discussed, your labs and imaging are reassuring.   I consulted nephrology and I have sent an urgent referral. They will call you in the next several days.  Return to the ED if symptoms symptoms worsen in the interim.

## 2023-01-02 NOTE — Progress Notes (Signed)
Received call from [facility] ER. Patient with history of SLE presents with gross hematuria. Cr intact, hgb stable. Apparently follows with urology outpatient and has been referred to our office. Per ER, appointment is in December. If patient is to be discharged from ER, then will try to expedite her appointment after I discuss with our schedulers tomorrow. Please call with any questions/concerns in the interim.  Anthony Sar, MD [facility]

## 2023-01-02 NOTE — ED Provider Notes (Signed)
[place] [facility] Provider Note   CSN: [id number] Arrival date & time: 01/02/23  1650     History  Chief Complaint  Patient presents with   Lupus    Flank pain     Amy Mathis is a 42 y.o. female with a history of lupus, IBS, and chronic flank pain sent to the ED today concerned for lupus flare and flank pain.  Patient reports that she has been having pain all over the past several days.  She is not currently taking any maintenance medication for lupus.  She is working with her PCP to get established with a rheumatologist.  Patient reports right flank pain that started several days ago and gross hematuria.  Patient states she has been having gross hematuria for the past several months but denies any dysuria or suprapubic discomfort associated with it.  She is supposed to get established with nephrology in December. No fevers, chills, nausea, or vomiting.  Subsequently, patient reports that she has been noticing bright red blood when she wipes after bowel movements.  She endorses pain at the rectum as well and believes she has anal fissure.  No additional complaints or concerns at this time.    Home Medications Prior to Admission medications   Medication Sig Start Date End Date Taking? Authorizing Provider  gabapentin (NEURONTIN) 100 MG capsule Take 1 capsule (100 mg total) by mouth 3 (three) times daily for 10 days. 12/06/22 12/16/22  Arthor Captain, PA-C  hydroxychloroquine (PLAQUENIL) 200 MG tablet Take by mouth 2 (two) times daily.    [provider]  oxyCODONE (ROXICODONE) 5 MG immediate release tablet Take 1 tablet (5 mg total) by mouth every 8 (eight) hours as needed for up to 10 doses for severe pain. 10/03/22   Smoot, Shawn Route, PA-C  pantoprazole (PROTONIX) 20 MG tablet Take 2 tablets (40 mg total) by mouth daily. 09/16/22   Alvira Monday, MD  promethazine (PHENERGAN) 25 MG tablet Take 1 tablet (25 mg total) by mouth every 6 (six)  hours as needed for nausea or vomiting. 09/14/22   Renne Crigler, PA-C  valACYclovir (VALTREX) 1000 MG tablet Take 1 tablet (1,000 mg total) by mouth 3 (three) times daily. 12/06/22   Arthor Captain, PA-C      Allergies    Morphine, Morphine and codeine, Penicillin g, Penicillins, Tramadol, Meperidine hcl, and Nsaids    Review of Systems   Review of Systems  Genitourinary:  Positive for flank pain.  All other systems reviewed and are negative.   Physical Exam Updated Vital Signs BP 136/87   Pulse 82   Temp 98.7 F (37.1 C) (Oral)   Resp 17   Wt (!) 156 kg   SpO2 96%   BMI 52.29 kg/m  Physical Exam Vitals and nursing note reviewed.  Constitutional:      General: She is not in acute distress.    Appearance: Normal appearance.  HENT:     Head: Normocephalic and atraumatic.     Mouth/Throat:     Mouth: Mucous membranes are moist.  Eyes:     Conjunctiva/sclera: Conjunctivae normal.     Pupils: Pupils are equal, round, and reactive to light.  Cardiovascular:     Rate and Rhythm: Normal rate and regular rhythm.     Pulses: Normal pulses.     Heart sounds: Normal heart sounds.  Pulmonary:     Effort: Pulmonary effort is normal.     Breath sounds: Normal breath sounds.  Abdominal:     Palpations: Abdomen is soft.     Tenderness: There is no abdominal tenderness. There is right CVA tenderness.  Genitourinary:    Comments: Patient denied rectal exam Musculoskeletal:        General: Normal range of motion.     Cervical back: Normal range of motion.  Skin:    General: Skin is warm and dry.     Findings: No rash.  Neurological:     General: No focal deficit present.     Mental Status: She is alert.     Sensory: No sensory deficit.     Motor: No weakness.  Psychiatric:        Mood and Affect: Mood normal.        Behavior: Behavior normal.    ED Results / Procedures / Treatments   Labs (all labs ordered are listed, but only abnormal results are displayed) Labs  Reviewed  URINALYSIS, W/ REFLEX TO CULTURE (INFECTION SUSPECTED) - Abnormal; Notable for the following components:      Result Value   Color, Urine RED (*)    APPearance TURBID (*)    Glucose, UA   (*)    Value: TEST NOT REPORTED DUE TO COLOR INTERFERENCE OF URINE PIGMENT   Hgb urine dipstick   (*)    Value: TEST NOT REPORTED DUE TO COLOR INTERFERENCE OF URINE PIGMENT   Bilirubin Urine   (*)    Value: TEST NOT REPORTED DUE TO COLOR INTERFERENCE OF URINE PIGMENT   Ketones, ur   (*)    Value: TEST NOT REPORTED DUE TO COLOR INTERFERENCE OF URINE PIGMENT   Protein, ur   (*)    Value: TEST NOT REPORTED DUE TO COLOR INTERFERENCE OF URINE PIGMENT   Nitrite   (*)    Value: TEST NOT REPORTED DUE TO COLOR INTERFERENCE OF URINE PIGMENT   Leukocytes,Ua   (*)    Value: TEST NOT REPORTED DUE TO COLOR INTERFERENCE OF URINE PIGMENT   Bacteria, UA RARE (*)    All other components within normal limits  COMPREHENSIVE METABOLIC PANEL - Abnormal; Notable for the following components:   Glucose, Bld 100 (*)    Total Protein 8.7 (*)    All other components within normal limits  CBC WITH DIFFERENTIAL/PLATELET - Abnormal; Notable for the following components:   WBC 11.0 (*)    All other components within normal limits    EKG None  Radiology CT ABDOMEN PELVIS W CONTRAST  Result Date: 01/02/2023 CLINICAL DATA:  Abdominal/flank pain, stone suspected EXAM: CT ABDOMEN AND PELVIS WITH CONTRAST TECHNIQUE: Multidetector CT imaging of the abdomen and pelvis was performed using the standard protocol following bolus administration of intravenous contrast. RADIATION DOSE REDUCTION: This exam was performed according to the departmental dose-optimization program which includes automated exposure control, adjustment of the mA and/or kV according to patient size and/or use of iterative reconstruction technique. CONTRAST:  OMNIPAQUE IOHEXOL 300 MG/ML  SOLN COMPARISON:  08/30/2022 FINDINGS: Lower chest: No acute  abnormality Hepatobiliary: No focal liver abnormality is seen. Status post cholecystectomy. Slight prominence of the intrahepatic and extrahepatic ducts likely related to post cholecystectomy state. Pancreas: No focal abnormality or ductal dilatation. Spleen: No focal abnormality.  Normal size. Adrenals/Urinary Tract: No adrenal abnormality. No focal renal abnormality. No stones or hydronephrosis. Urinary bladder is unremarkable. Stomach/Bowel: Stomach, large and small bowel grossly unremarkable. Appendix not visualized. Vascular/Lymphatic: No evidence of aneurysm or adenopathy. Reproductive: Prior hysterectomy.  No adnexal masses. Other: No  free fluid or free air. Musculoskeletal: No acute bony abnormality. IMPRESSION: No acute findings in the abdomen or pelvis. Electronically Signed   By: Charlett Nose M.D.   On: 01/02/2023 19:55    Procedures Procedures: not indicated.   Medications Ordered in ED Medications  HYDROmorphone (DILAUDID) injection 1 mg (1 mg Intravenous Given 01/02/23 1850)  iohexol (OMNIPAQUE) 300 MG/ML solution 125 mL (125 mLs Intravenous Contrast Given 01/02/23 1926)  HYDROmorphone (DILAUDID) injection 1 mg (1 mg Intravenous Given 01/02/23 2039)  dexamethasone (DECADRON) injection 10 mg (10 mg Intravenous Given 01/02/23 2131)  HYDROmorphone (DILAUDID) injection 1 mg (1 mg Intravenous Given 01/02/23 2253)    ED Course/ Medical Decision Making/ A&P                                 Medical Decision Making Amount and/or Complexity of Data Reviewed Labs: ordered. Radiology: ordered.  Risk Prescription drug management.   This patient presents to the ED for concern of flank pain, this involves an extensive number of treatment options, and is a complaint that carries with it a high risk of complications and morbidity.   Differential diagnosis includes: Lupus flare, UTI, pyelonephritis, nephrolithiasis, urolithiasis, muscle strain, etc.   Comorbidities  See HPI  above   Additional History  Additional history obtained from previous records.   Lab Tests  I ordered and personally interpreted labs.  The pertinent results include:   Red and turbid urine - nitrites,  leukocytes, proteins, or ketones unable to be calculated due to urine pigmentation. CMP and CBC are within normal limits no AKI, transaminitis, electrolyte abnormality, acute infection, or anemia.   Imaging Studies  I ordered imaging studies including CT abdomen/pelvis with contrast. I independently visualized and interpreted imaging which showed: No acute findings in the abdomen or pelvis. I agree with the radiologist interpretation   Consultations  I requested consultation with nephrology,  and discussed lab and imaging findings as well as pertinent plan - they recommend: Send ambulatory referral to their group and they will get patient in sooner to establish care.  She states that her PCP sent a referral in the early she get in his December.   Problem List / ED Course / Critical Interventions / Medication Management  Right flank pain and lupus flare I ordered medications including: Dilaudid for pain Decadron for pain Reevaluation of the patient after these medicines showed that the patient improved. I have reviewed the patients home medicines and have made adjustments as needed.   Social Determinants of Health  Access to healthcare   Test / Admission - Considered  Discussed findings with patient.  She is hemodynamically stable and safe for discharge home. Ambulatory referral to nephrology sent. Return precautions provided.       Final Clinical Impression(s) / ED Diagnoses Final diagnoses:  Chronic flank pain  SLE exacerbation [facility])    Rx / DC Orders ED Discharge Orders          Ordered    Ambulatory referral to Nephrology       Comments: Gross hematuria   01/02/23 2151              Maxwell Marion, PA-C 01/03/23 0111    Laurence Spates,  MD 01/03/23 1501

## 2023-01-02 NOTE — ED Triage Notes (Signed)
Pr reports lupus flair up , right flank pain , hematuria x 2 days . Pain all over.

## 2023-01-03 ENCOUNTER — Encounter (HOSPITAL_BASED_OUTPATIENT_CLINIC_OR_DEPARTMENT_OTHER): Payer: Self-pay | Admitting: Emergency Medicine

## 2023-01-03 ENCOUNTER — Other Ambulatory Visit: Payer: Self-pay

## 2023-01-03 ENCOUNTER — Emergency Department (HOSPITAL_BASED_OUTPATIENT_CLINIC_OR_DEPARTMENT_OTHER)
Admission: EM | Admit: 2023-01-03 | Discharge: 2023-01-03 | Disposition: A | Discharge status: Home / Self Care | Payer: Medicaid Other | Attending: Emergency Medicine | Admitting: Emergency Medicine

## 2023-01-03 DIAGNOSIS — R310 Gross hematuria: Secondary | ICD-10-CM | POA: Diagnosis not present

## 2023-01-03 DIAGNOSIS — D72829 Elevated white blood cell count, unspecified: Secondary | ICD-10-CM | POA: Insufficient documentation

## 2023-01-03 DIAGNOSIS — R110 Nausea: Secondary | ICD-10-CM | POA: Insufficient documentation

## 2023-01-03 DIAGNOSIS — R109 Unspecified abdominal pain: Secondary | ICD-10-CM | POA: Insufficient documentation

## 2023-01-03 LAB — COMPREHENSIVE METABOLIC PANEL
ALT: 20 U/L (ref 0–44)
AST: 24 U/L (ref 15–41)
Albumin: 3.8 g/dL (ref 3.5–5.0)
Alkaline Phosphatase: 56 U/L (ref 38–126)
Anion gap: 14 (ref 5–15)
BUN: 16 mg/dL (ref 6–20)
CO2: 22 mmol/L (ref 22–32)
Calcium: 9.5 mg/dL (ref 8.9–10.3)
Chloride: 102 mmol/L (ref 98–111)
Creatinine, Ser: 0.82 mg/dL (ref 0.44–1.00)
GFR, Estimated: >60 mL/min (ref >60)
Glucose, Bld: 153 mg/dL — ABNORMAL HIGH (ref 70–99)
Potassium: 3.5 mmol/L (ref 3.5–5.1)
Sodium: 138 mmol/L (ref 135–145)
Total Bilirubin: 0.6 mg/dL (ref 0.3–1.2)
Total Protein: 8.2 g/dL — ABNORMAL HIGH (ref 6.5–8.1)

## 2023-01-03 LAB — CBC
HCT: 36.1 % (ref 36.0–46.0)
Hemoglobin: 13.1 g/dL (ref 12.0–15.0)
MCH: 29.4 pg (ref 26.0–34.0)
MCHC: 36.3 g/dL — ABNORMAL HIGH (ref 30.0–36.0)
MCV: 80.9 fL (ref 80.0–100.0)
Platelets: 433 10*3/uL — ABNORMAL HIGH (ref 150–400)
RBC: 4.46 MIL/uL (ref 3.87–5.11)
RDW: 14.2 % (ref 11.5–15.5)
WBC: 18 10*3/uL — ABNORMAL HIGH (ref 4.0–10.5)
nRBC: 0 % (ref 0.0–0.2)

## 2023-01-03 LAB — URINALYSIS, ROUTINE W REFLEX MICROSCOPIC

## 2023-01-03 LAB — URINALYSIS, MICROSCOPIC (REFLEX): RBC / HPF: >50 RBC/hpf (ref 0–5)

## 2023-01-03 LAB — LIPASE, BLOOD: Lipase: 27 U/L (ref 11–51)

## 2023-01-03 MED ORDER — KETOROLAC TROMETHAMINE 15 MG/ML IJ SOLN
15.0000 mg | Freq: Once | INTRAMUSCULAR | Status: AC
  Administered 2023-01-03: 15 mg via INTRAVENOUS
  Filled 2023-01-03: qty 1

## 2023-01-03 MED ORDER — ACETAMINOPHEN 500 MG PO TABS
1000.0000 mg | ORAL_TABLET | Freq: Once | ORAL | Status: AC
  Administered 2023-01-03: 1000 mg via ORAL
  Filled 2023-01-03: qty 2

## 2023-01-03 MED ORDER — OXYCODONE HCL 5 MG PO TABS
5.0000 mg | ORAL_TABLET | Freq: Once | ORAL | Status: AC
  Administered 2023-01-03: 5 mg via ORAL
  Filled 2023-01-03: qty 1

## 2023-01-03 MED ORDER — HYDROMORPHONE HCL 1 MG/ML IJ SOLN
0.5000 mg | Freq: Once | INTRAMUSCULAR | Status: AC
  Administered 2023-01-03: 0.5 mg via INTRAVENOUS
  Filled 2023-01-03: qty 1

## 2023-01-03 NOTE — ED Triage Notes (Addendum)
Patient complaining of lupus flare causing right side flank pain. Seen yesterday and had relief from treatment but states the pain came back more severe. Taking prescribed oxycodone without relief. Endorses hematuria.

## 2023-01-03 NOTE — ED Provider Notes (Signed)
[facility] Provider Note   CSN: [id number] Arrival date & time: 01/03/23  1923     History  Chief Complaint  Patient presents with   Flank Pain    Amy Mathis is a 42 y.o. female with history of IBS, lupus, chronic right-sided flank pain, presents with concern for a flareup of her pain.  States this flareup has been ongoing for the past 3 days.  Pain is not controlled with her at home oxycodone.  Reports nausea but no vomiting.  Reports nonbloody diarrhea.  Denies fever or chills.  Denies any dysuria, increased frequency.  Her urine is more black rather than the red appearance that normally is.  Reports she has an appointment for urology scheduled in December. She has not taken her Plaquenil about 2 months as she is trying to establish care with a rheumatologist.   Flank Pain       Home Medications Prior to Admission medications   Medication Sig Start Date End Date Taking? Authorizing Provider  oxyCODONE (ROXICODONE) 5 MG immediate release tablet Take 1 tablet (5 mg total) by mouth every 8 (eight) hours as needed for up to 10 doses for severe pain. 10/03/22  Yes Smoot, Shawn Route, PA-C  promethazine (PHENERGAN) 25 MG tablet Take 1 tablet (25 mg total) by mouth every 6 (six) hours as needed for nausea or vomiting. 09/14/22  Yes Renne Crigler, PA-C  gabapentin (NEURONTIN) 100 MG capsule Take 1 capsule (100 mg total) by mouth 3 (three) times daily for 10 days. 12/06/22 12/16/22  Arthor Captain, PA-C  hydroxychloroquine (PLAQUENIL) 200 MG tablet Take by mouth 2 (two) times daily.    [provider]  pantoprazole (PROTONIX) 20 MG tablet Take 2 tablets (40 mg total) by mouth daily. 09/16/22   Alvira Monday, MD  valACYclovir (VALTREX) 1000 MG tablet Take 1 tablet (1,000 mg total) by mouth 3 (three) times daily. 12/06/22   Arthor Captain, PA-C      Allergies    Morphine, Morphine and codeine, Penicillin g, Penicillins, Tramadol,  Meperidine hcl, and Nsaids    Review of Systems   Review of Systems  Genitourinary:  Positive for flank pain.    Physical Exam Updated Vital Signs BP 129/84   Pulse 85   Temp 97.9 F (36.6 C)   Resp 16   Ht 5\' 8"  (1.727 m)   Wt (!) 152.4 kg   SpO2 99%   BMI 51.09 kg/m  Physical Exam Vitals and nursing note reviewed.  Constitutional:      General: She is not in acute distress.    Appearance: She is well-developed.     Comments: Appears uncomfortable, no active emesis  HENT:     Head: Normocephalic and atraumatic.  Eyes:     Conjunctiva/sclera: Conjunctivae normal.  Cardiovascular:     Rate and Rhythm: Normal rate and regular rhythm.     Heart sounds: No murmur heard. Pulmonary:     Effort: Pulmonary effort is normal. No respiratory distress.     Breath sounds: Normal breath sounds.  Abdominal:     Palpations: Abdomen is soft.     Tenderness: There is no abdominal tenderness. There is no right CVA tenderness or left CVA tenderness.     Comments: Abdomen soft and non-tender to palpation  Musculoskeletal:        General: No swelling.     Cervical back: Neck supple.  Skin:    General: Skin is warm and dry.  Capillary Refill: Capillary refill takes less than 2 seconds.  Neurological:     Mental Status: She is alert.  Psychiatric:        Mood and Affect: Mood normal.     ED Results / Procedures / Treatments   Labs (all labs ordered are listed, but only abnormal results are displayed) Labs Reviewed  CBC - Abnormal; Notable for the following components:      Result Value   WBC 18.0 (*)    MCHC 36.3 (*)    Platelets 433 (*)    All other components within normal limits  COMPREHENSIVE METABOLIC PANEL - Abnormal; Notable for the following components:   Glucose, Bld 153 (*)    Total Protein 8.2 (*)    All other components within normal limits  URINALYSIS, ROUTINE W REFLEX MICROSCOPIC - Abnormal; Notable for the following components:   Color, Urine RED (*)     APPearance TURBID (*)    Glucose, UA   (*)    Value: TEST NOT REPORTED DUE TO COLOR INTERFERENCE OF URINE PIGMENT   Hgb urine dipstick   (*)    Value: TEST NOT REPORTED DUE TO COLOR INTERFERENCE OF URINE PIGMENT   Bilirubin Urine   (*)    Value: TEST NOT REPORTED DUE TO COLOR INTERFERENCE OF URINE PIGMENT   Ketones, ur   (*)    Value: TEST NOT REPORTED DUE TO COLOR INTERFERENCE OF URINE PIGMENT   Protein, ur   (*)    Value: TEST NOT REPORTED DUE TO COLOR INTERFERENCE OF URINE PIGMENT   Nitrite   (*)    Value: TEST NOT REPORTED DUE TO COLOR INTERFERENCE OF URINE PIGMENT   Leukocytes,Ua   (*)    Value: TEST NOT REPORTED DUE TO COLOR INTERFERENCE OF URINE PIGMENT   All other components within normal limits  URINALYSIS, MICROSCOPIC (REFLEX) - Abnormal; Notable for the following components:   Bacteria, UA FEW (*)    All other components within normal limits  LIPASE, BLOOD    EKG None  Radiology CT ABDOMEN PELVIS W CONTRAST  Result Date: 01/02/2023 CLINICAL DATA:  Abdominal/flank pain, stone suspected EXAM: CT ABDOMEN AND PELVIS WITH CONTRAST TECHNIQUE: Multidetector CT imaging of the abdomen and pelvis was performed using the standard protocol following bolus administration of intravenous contrast. RADIATION DOSE REDUCTION: This exam was performed according to the departmental dose-optimization program which includes automated exposure control, adjustment of the mA and/or kV according to patient size and/or use of iterative reconstruction technique. CONTRAST:  OMNIPAQUE IOHEXOL 300 MG/ML  SOLN COMPARISON:  08/30/2022 FINDINGS: Lower chest: No acute abnormality Hepatobiliary: No focal liver abnormality is seen. Status post cholecystectomy. Slight prominence of the intrahepatic and extrahepatic ducts likely related to post cholecystectomy state. Pancreas: No focal abnormality or ductal dilatation. Spleen: No focal abnormality.  Normal size. Adrenals/Urinary Tract: No adrenal abnormality.  No focal renal abnormality. No stones or hydronephrosis. Urinary bladder is unremarkable. Stomach/Bowel: Stomach, large and small bowel grossly unremarkable. Appendix not visualized. Vascular/Lymphatic: No evidence of aneurysm or adenopathy. Reproductive: Prior hysterectomy.  No adnexal masses. Other: No free fluid or free air. Musculoskeletal: No acute bony abnormality. IMPRESSION: No acute findings in the abdomen or pelvis. Electronically Signed   By: Charlett Nose M.D.   On: 01/02/2023 19:55    Procedures Procedures    Medications Ordered in ED Medications  HYDROmorphone (DILAUDID) injection 0.5 mg (0.5 mg Intravenous Given 01/03/23 2017)  ketorolac (TORADOL) 15 MG/ML injection 15 mg (15 mg Intravenous  Given 01/03/23 2018)  acetaminophen (TYLENOL) tablet 1,000 mg (1,000 mg Oral Given 01/03/23 2217)  oxyCODONE (Oxy IR/ROXICODONE) immediate release tablet 5 mg (5 mg Oral Given 01/03/23 2301)    ED Course/ Medical Decision Making/ A&P Clinical Course as of 01/03/23 2314  Fri Jan 03, 2023  2205 At bedside.  This is appearing to be a chronic pain exacerbation.  Lab work without any other focal pathology (white count likely secondary to steroid administration yesterday as she is afebrile and has no localizing signs for infection) I do not believe that repeating her cross-sectional imaging would be diagnostic given the benign nature of her exam at this time. Have discussed ongoing supportive care in the outpatient setting. [CC]    Clinical Course User Index [CC] Glyn Ade, MD                                 Medical Decision Making Amount and/or Complexity of Data Reviewed Labs: ordered.  Risk OTC drugs. Prescription drug management.   42 y.o. female with pertinent past medical history of IBS, lupus, chronic right-sided flank pain and hematuria,  presents to the ED for concern of flare up of right sided flank pain  Differential diagnosis includes but is not limited to UTI,  nephrolithiasis, lupus  ED Course:  Patient appears slightly uncomfortable in bed, but no acute distress.  Her vital signs are normal except for a slightly elevated blood pressure at 149/72.  Afebrile.  She is concerned for a flareup of her baseline right sided flank pain.  Reports pain has not been well-controlled at home with oxycodone since her discharge yesterday, but no acute worsening.  Low suspicion for any acute abdominal pathology given no worsening of symptoms and CT abdomen pelvis yesterday unremarkable.  CMP unremarkable, CBC with leukocytosis of 18, however, patient was given Decadron yesterday.  No fevers, no tachycardia.  Low suspicion for any infectious process given explanation for this leukocytosis and no worsening of her symptoms.  No indication for further imaging at this time.  Patient has gross hematuria at her baseline, evaluate for leukocytes or nitrites due to the turbidity.  However, she denies any urinary symptoms, low suspicion for UTI at this time.  For patient was given IV Toradol, tylenol, and 0.5mg  dilaudid here for pain control. Upon reevaluation, patient reports the pain has improved slightly, but asked for Dilaudid.  We discussed that this is not indicated for her pain and can make her at home medications less effective.  She is in understanding.  She was given a dose of her home 5 mg oxycodone prior to discharge.  Patient appropriate for discharge home at this time.  Impression: Right-sided flank pain Hematuria  Disposition:  The patient was discharged home with instructions to follow-up with her PCP as needed.  Attend your urology appointment in December. Return precautions given.  Lab Tests: I Ordered, and personally interpreted labs.  The pertinent results include:   CBC with leukocytosis of 18 CMP unremarkable Lipase within normal limits Urine with gross hematuria  External records from outside source obtained and reviewed including CT abdomen pelvis  from yesterday revealing no acute abnormalities, ER note from yesterday where she was given dilaudid and Toradol for pain control   Co morbidities that complicate the patient evaluation  IBS, lupus, chronic right sided flank pain and hematuria             Final Clinical Impression(s) /  ED Diagnoses Final diagnoses:  Flank pain  Gross hematuria    Rx / DC Orders ED Discharge Orders     None         Arabella Merles, PA-C 01/03/23 2314    Glyn Ade, MD 01/04/23 1452

## 2023-01-03 NOTE — Discharge Instructions (Signed)
Your lab work is reassuring today.   Please attend your urology appointment in December.  You may take up to 1000mg  of tylenol every 6 hours as needed for pain.  Do not take more then 4g per day.  Return to the ER for any fevers, severe abdominal pain, any other new or concerning symptoms.

## 2023-01-04 ENCOUNTER — Other Ambulatory Visit: Payer: Self-pay

## 2023-01-04 ENCOUNTER — Emergency Department (HOSPITAL_BASED_OUTPATIENT_CLINIC_OR_DEPARTMENT_OTHER)
Admission: EM | Admit: 2023-01-04 | Discharge: 2023-01-04 | Discharge status: Against Medical Advice | Payer: Medicaid Other | Attending: Emergency Medicine | Admitting: Emergency Medicine

## 2023-01-04 ENCOUNTER — Encounter (HOSPITAL_BASED_OUTPATIENT_CLINIC_OR_DEPARTMENT_OTHER): Payer: Self-pay

## 2023-01-04 DIAGNOSIS — R109 Unspecified abdominal pain: Secondary | ICD-10-CM | POA: Insufficient documentation

## 2023-01-04 MED ORDER — OXYCODONE HCL 5 MG PO TABS
5.0000 mg | ORAL_TABLET | Freq: Once | ORAL | Status: DC
  Filled 2023-01-04: qty 1

## 2023-01-04 MED ORDER — ACETAMINOPHEN 500 MG PO TABS
1000.0000 mg | ORAL_TABLET | Freq: Once | ORAL | Status: DC
  Filled 2023-01-04: qty 2

## 2023-01-04 MED ORDER — LIDOCAINE 5 % EX PTCH
1.0000 | MEDICATED_PATCH | CUTANEOUS | Status: DC
  Filled 2023-01-04: qty 1

## 2023-01-04 NOTE — ED Triage Notes (Signed)
The patient is having flank pain and hematuria for three days. No fever.

## 2023-01-04 NOTE — ED Provider Notes (Signed)
[facility] Provider Note   CSN: [id number] Arrival date & time: 01/04/23  1703     History Chief Complaint  Patient presents with   Flank Pain   Hematuria    HPI Amy Mathis is a 42 y.o. female presenting for chronic right-sided flank pain.  Seen yesterday for the same as well as 17 times in the last 6 months. Same symptoms as yesterday's H&P, no interval change.   Patient's recorded medical, surgical, social, medication list and allergies were reviewed in the Snapshot window as part of the initial history.   Review of Systems   Review of Systems  Constitutional:  Negative for chills and fever.  HENT:  Negative for ear pain and sore throat.   Eyes:  Negative for pain and visual disturbance.  Respiratory:  Negative for cough and shortness of breath.   Cardiovascular:  Negative for chest pain and palpitations.  Gastrointestinal:  Negative for abdominal pain and vomiting.  Genitourinary:  Positive for flank pain and hematuria. Negative for dysuria.  Musculoskeletal:  Negative for arthralgias and back pain.  Skin:  Negative for color change and rash.  Neurological:  Negative for seizures and syncope.  All other systems reviewed and are negative.   Physical Exam Updated Vital Signs BP (!) 130/90   Pulse 90   Temp 98.2 F (36.8 C)   Resp 16   Ht 5\' 8"  (1.727 m)   Wt (!) 152 kg   SpO2 99%   BMI 50.95 kg/m  Physical Exam Constitutional:      General: She is not in acute distress.    Appearance: She is not ill-appearing or toxic-appearing.  HENT:     Head: Normocephalic and atraumatic.  Eyes:     Extraocular Movements: Extraocular movements intact.     Pupils: Pupils are equal, round, and reactive to light.  Cardiovascular:     Rate and Rhythm: Normal rate.  Pulmonary:     Effort: No respiratory distress.  Abdominal:     General: Abdomen is flat.  Musculoskeletal:        General: No swelling, deformity or signs  of injury.     Cervical back: Normal range of motion. No rigidity.  Skin:    General: Skin is warm and dry.  Neurological:     General: No focal deficit present.     Mental Status: She is alert and oriented to person, place, and time.  Psychiatric:        Mood and Affect: Mood normal.      ED Course/ Medical Decision Making/ A&P    Procedures Procedures   Medications Ordered in ED Medications  acetaminophen (TYLENOL) tablet 1,000 mg (has no administration in time range)  oxyCODONE (Oxy IR/ROXICODONE) immediate release tablet 5 mg (has no administration in time range)  lidocaine (LIDODERM) 5 % 1 patch (has no administration in time range)    Medical Decision Making:   42 year old female presenting with an exacerbation of her chronic pain.  She has no acute complaints today besides her ongoing pain.  See my attestation and evaluation from yesterday.  Same exam same findings at this time. Will follow-up CBC and BMP, will treat with her home dose pain medication as this is chronic pain, informed patient that we will not be administering any further narcotic IV and that she needs to follow-up with her chronic pain providers. She has already had extensive evaluation over the past 48 hours including cross-sectional imaging and  her presentation is the exact same as numerous presentations in the EMR to this facility.  This is more consistent with chronic pain than any acute exacerbation.  If blood work remains benign patient will be discharged outpatient care and management.   Reassessment: I was told that patient refused any further workup in the emergency department and eloped prior to any further conversations.   Clinical Impression:  1. Right flank pain      Data Unavailable   Final Clinical Impression(s) / ED Diagnoses Final diagnoses:  Right flank pain    Rx / DC Orders ED Discharge Orders     None         Glyn Ade, MD 01/04/23 1754

## 2023-01-06 NOTE — Plan of Care (Signed)
[facility] Tonsillectomy/Adenoidectomy, Postoperative PEDS care plan entered in error.

## 2023-02-05 ENCOUNTER — Emergency Department (HOSPITAL_BASED_OUTPATIENT_CLINIC_OR_DEPARTMENT_OTHER)
Admission: EM | Admit: 2023-02-05 | Discharge: 2023-02-05 | Discharge status: Against Medical Advice | Payer: Medicaid Other | Attending: Emergency Medicine | Admitting: Emergency Medicine

## 2023-02-05 ENCOUNTER — Emergency Department (HOSPITAL_BASED_OUTPATIENT_CLINIC_OR_DEPARTMENT_OTHER): Payer: Medicaid Other

## 2023-02-05 ENCOUNTER — Other Ambulatory Visit: Payer: Self-pay

## 2023-02-05 ENCOUNTER — Encounter (HOSPITAL_BASED_OUTPATIENT_CLINIC_OR_DEPARTMENT_OTHER): Payer: Self-pay

## 2023-02-05 DIAGNOSIS — M25561 Pain in right knee: Secondary | ICD-10-CM | POA: Diagnosis not present

## 2023-02-05 DIAGNOSIS — W109XXA Fall (on) (from) unspecified stairs and steps, initial encounter: Secondary | ICD-10-CM | POA: Diagnosis not present

## 2023-02-05 DIAGNOSIS — M7989 Other specified soft tissue disorders: Secondary | ICD-10-CM | POA: Insufficient documentation

## 2023-02-05 DIAGNOSIS — M25532 Pain in left wrist: Secondary | ICD-10-CM | POA: Diagnosis present

## 2023-02-05 DIAGNOSIS — Z5321 Procedure and treatment not carried out due to patient leaving prior to being seen by health care provider: Secondary | ICD-10-CM | POA: Diagnosis not present

## 2023-02-05 NOTE — ED Notes (Signed)
Name called x3 , no answer

## 2023-02-05 NOTE — ED Triage Notes (Signed)
In for eval of left wrist pain and swelling sec to trip and fall down 5 steps. Hit right knee as well - no complaints.

## 2023-03-01 ENCOUNTER — Emergency Department (HOSPITAL_BASED_OUTPATIENT_CLINIC_OR_DEPARTMENT_OTHER)
Admission: EM | Admit: 2023-03-01 | Discharge: 2023-03-01 | Disposition: A | Discharge status: Home / Self Care | Payer: Medicaid Other | Attending: Emergency Medicine | Admitting: Emergency Medicine

## 2023-03-01 ENCOUNTER — Encounter (HOSPITAL_BASED_OUTPATIENT_CLINIC_OR_DEPARTMENT_OTHER): Payer: Self-pay | Admitting: Urology

## 2023-03-01 ENCOUNTER — Other Ambulatory Visit: Payer: Self-pay

## 2023-03-01 DIAGNOSIS — R1084 Generalized abdominal pain: Secondary | ICD-10-CM | POA: Insufficient documentation

## 2023-03-01 LAB — BASIC METABOLIC PANEL
Anion gap: 7 (ref 5–15)
BUN: 9 mg/dL (ref 6–20)
CO2: 25 mmol/L (ref 22–32)
Calcium: 8.4 mg/dL — ABNORMAL LOW (ref 8.9–10.3)
Chloride: 104 mmol/L (ref 98–111)
Creatinine, Ser: 0.82 mg/dL (ref 0.44–1.00)
GFR, Estimated: >60 mL/min (ref >60)
Glucose, Bld: 96 mg/dL (ref 70–99)
Potassium: 3.5 mmol/L (ref 3.5–5.1)
Sodium: 136 mmol/L (ref 135–145)

## 2023-03-01 LAB — CBC WITH DIFFERENTIAL/PLATELET
Abs Immature Granulocytes: 0.01 10*3/uL (ref 0.00–0.07)
Basophils Absolute: 0.1 10*3/uL (ref 0.0–0.1)
Basophils Relative: 1 %
Eosinophils Absolute: 0.3 10*3/uL (ref 0.0–0.5)
Eosinophils Relative: 3 %
HCT: 35.5 % — ABNORMAL LOW (ref 36.0–46.0)
Hemoglobin: 12.5 g/dL (ref 12.0–15.0)
Immature Granulocytes: 0 %
Lymphocytes Relative: 40 %
Lymphs Abs: 3.8 10*3/uL (ref 0.7–4.0)
MCH: 28.7 pg (ref 26.0–34.0)
MCHC: 35.2 g/dL (ref 30.0–36.0)
MCV: 81.6 fL (ref 80.0–100.0)
Monocytes Absolute: 0.7 10*3/uL (ref 0.1–1.0)
Monocytes Relative: 7 %
Neutro Abs: 4.8 10*3/uL (ref 1.7–7.7)
Neutrophils Relative %: 49 %
Platelets: 399 10*3/uL (ref 150–400)
RBC: 4.35 MIL/uL (ref 3.87–5.11)
RDW: 14.2 % (ref 11.5–15.5)
WBC: 9.6 10*3/uL (ref 4.0–10.5)
nRBC: 0 % (ref 0.0–0.2)

## 2023-03-01 MED ORDER — OXYCODONE HCL 5 MG PO TABS
5.0000 mg | ORAL_TABLET | Freq: Once | ORAL | Status: AC
  Administered 2023-03-01: 5 mg via ORAL
  Filled 2023-03-01: qty 1

## 2023-03-01 NOTE — ED Triage Notes (Signed)
Pt states lupus flare up x 3 days  States right side flank pain, joint pain, and legs welling associated

## 2023-03-01 NOTE — ED Notes (Signed)

## 2023-03-01 NOTE — ED Provider Notes (Signed)
[facility] Provider Note   CSN: [id number] Arrival date & time: 03/01/23  1742     History Chief Complaint  Patient presents with   Lupus Flare Up    HPI Amy Mathis is a 42 y.o. female presenting for chronic right-sided flank pain.  Patient with the same exact history of present illness as the last time I saw her 2 months ago.  Stating severe right-sided flank pain acute on chronic which she attributes to lupus.  States symptoms been going on for 5 days.  Allegedly messaged her PCP and scheduled for outpatient appointment but no new medications sent. Denies fevers chills nausea vomiting syncope shortness of breath.  No acute distress on my exam.   Patient's recorded medical, surgical, social, medication list and allergies were reviewed in the Snapshot window as part of the initial history.   Review of Systems   Review of Systems  Constitutional:  Negative for chills and fever.  HENT:  Negative for ear pain and sore throat.   Eyes:  Negative for pain and visual disturbance.  Respiratory:  Negative for cough and shortness of breath.   Cardiovascular:  Negative for chest pain and palpitations.  Gastrointestinal:  Negative for abdominal pain and vomiting.  Genitourinary:  Positive for flank pain and hematuria. Negative for dysuria.  Musculoskeletal:  Negative for arthralgias and back pain.  Skin:  Negative for color change and rash.  Neurological:  Negative for seizures and syncope.  All other systems reviewed and are negative.   Physical Exam Updated Vital Signs BP (!) 140/90 (BP Location: Left Arm)   Pulse 81   Temp 98.4 F (36.9 C) (Oral)   Resp 16   Ht 5\' 11"  (1.803 m)   Wt (!) 152 kg   SpO2 100%   BMI 46.74 kg/m  Physical Exam Vitals and nursing note reviewed.  Constitutional:      General: She is not in acute distress.    Appearance: She is well-developed.  HENT:     Head: Normocephalic and atraumatic.  Eyes:      Conjunctiva/sclera: Conjunctivae normal.  Cardiovascular:     Rate and Rhythm: Normal rate and regular rhythm.     Heart sounds: No murmur heard. Pulmonary:     Effort: Pulmonary effort is normal. No respiratory distress.     Breath sounds: Normal breath sounds.  Abdominal:     General: There is no distension.     Palpations: Abdomen is soft.     Tenderness: There is no abdominal tenderness. There is no right CVA tenderness or left CVA tenderness.  Musculoskeletal:        General: No swelling or tenderness. Normal range of motion.     Cervical back: Neck supple.  Skin:    General: Skin is warm and dry.  Neurological:     General: No focal deficit present.     Mental Status: She is alert and oriented to person, place, and time. Mental status is at baseline.     Cranial Nerves: No cranial nerve deficit.      ED Course/ Medical Decision Making/ A&P    Procedures Procedures   Medications Ordered in ED Medications  oxyCODONE (Oxy IR/ROXICODONE) immediate release tablet 5 mg (5 mg Oral Given 03/01/23 1801)    Medical Decision Making:   Patient with chronic right-sided flank pain.  Same history of present illness and same exam as prior visits with very nonspecific right-sided flank pain. Duration of  symptoms 4 days. She is in no acute distress at this time.  Will check blood work for AKI or evidence of lupus nephritis.  She has requested IV narcotics. Informed her that once again I would not be treating her with IV narcotics as this is an exacerbation of chronic pain and can be managed with her home pain medication regiment. Will treat her with her home p.o. oxycodone, check labs as above for acute flare and whether she needs steroid burst. Considered lupus nephritis as possible but given the chronic nature of her symptoms, this should be managed primarily by her PCP. Considered musculoskeletal etiology is most likely.  Considered nephrolithiasis, however patient has had  multiple cross-sectional studies this year without evidence of nephrolithiasis or other acute pathology.   Reassessment: On reassessment, she continues to not be in any acute distress.  Lab work showed no acute pathology.  All labs at baseline.  She is ambulatory tolerating p.o. intake on serial reassessment stable for outpatient care and management.  Disposition:  I have considered need for hospitalization, however, considering all of the above, I believe this patient is stable for discharge at this time.  Patient/family educated about specific return precautions for given chief complaint and symptoms.  Patient/family educated about follow-up with PCP.     Patient/family expressed understanding of return precautions and need for follow-up. Patient spoken to regarding all imaging and laboratory results and appropriate follow up for these results. All education provided in verbal form with additional information in written form. Time was allowed for answering of patient questions. Patient discharged.    Emergency Department Medication Summary:   Medications  oxyCODONE (Oxy IR/ROXICODONE) immediate release tablet 5 mg (5 mg Oral Given 03/01/23 1801)          Clinical Impression:  1. Generalized abdominal pain      Discharge   Final Clinical Impression(s) / ED Diagnoses Final diagnoses:  Generalized abdominal pain    Rx / DC Orders ED Discharge Orders     None         Glyn Ade, MD 03/01/23 [phone]

## 2023-03-04 ENCOUNTER — Emergency Department (HOSPITAL_BASED_OUTPATIENT_CLINIC_OR_DEPARTMENT_OTHER)
Admission: EM | Admit: 2023-03-04 | Discharge: 2023-03-04 | Discharge status: Against Medical Advice | Payer: Medicaid Other | Source: Home / Self Care

## 2023-06-15 ENCOUNTER — Encounter (HOSPITAL_BASED_OUTPATIENT_CLINIC_OR_DEPARTMENT_OTHER): Payer: Self-pay

## 2023-06-15 ENCOUNTER — Emergency Department (HOSPITAL_BASED_OUTPATIENT_CLINIC_OR_DEPARTMENT_OTHER)

## 2023-06-15 ENCOUNTER — Emergency Department (HOSPITAL_BASED_OUTPATIENT_CLINIC_OR_DEPARTMENT_OTHER)
Admission: EM | Admit: 2023-06-15 | Discharge: 2023-06-15 | Disposition: A | Discharge status: Home / Self Care | Attending: Emergency Medicine | Admitting: Emergency Medicine

## 2023-06-15 ENCOUNTER — Other Ambulatory Visit: Payer: Self-pay

## 2023-06-15 DIAGNOSIS — Z87442 Personal history of urinary calculi: Secondary | ICD-10-CM | POA: Insufficient documentation

## 2023-06-15 DIAGNOSIS — N390 Urinary tract infection, site not specified: Secondary | ICD-10-CM | POA: Diagnosis not present

## 2023-06-15 DIAGNOSIS — R109 Unspecified abdominal pain: Secondary | ICD-10-CM | POA: Diagnosis present

## 2023-06-15 LAB — CBC WITH DIFFERENTIAL/PLATELET
Abs Immature Granulocytes: 0.04 10*3/uL (ref 0.00–0.07)
Basophils Absolute: 0 10*3/uL (ref 0.0–0.1)
Basophils Relative: 0 %
Eosinophils Absolute: 0.2 10*3/uL (ref 0.0–0.5)
Eosinophils Relative: 2 %
HCT: 37.3 % (ref 36.0–46.0)
Hemoglobin: 13.3 g/dL (ref 12.0–15.0)
Immature Granulocytes: 0 %
Lymphocytes Relative: 32 %
Lymphs Abs: 2.9 10*3/uL (ref 0.7–4.0)
MCH: 29 pg (ref 26.0–34.0)
MCHC: 35.7 g/dL (ref 30.0–36.0)
MCV: 81.4 fL (ref 80.0–100.0)
Monocytes Absolute: 0.4 10*3/uL (ref 0.1–1.0)
Monocytes Relative: 5 %
Neutro Abs: 5.4 10*3/uL (ref 1.7–7.7)
Neutrophils Relative %: 61 %
Platelets: 345 10*3/uL (ref 150–400)
RBC: 4.58 MIL/uL (ref 3.87–5.11)
RDW: 14.6 % (ref 11.5–15.5)
WBC: 9 10*3/uL (ref 4.0–10.5)
nRBC: 0 % (ref 0.0–0.2)

## 2023-06-15 LAB — COMPREHENSIVE METABOLIC PANEL WITH GFR
ALT: 19 U/L (ref 0–44)
AST: 30 U/L (ref 15–41)
Albumin: 3.6 g/dL (ref 3.5–5.0)
Alkaline Phosphatase: 49 U/L (ref 38–126)
Anion gap: 10 (ref 5–15)
BUN: 9 mg/dL (ref 6–20)
CO2: 25 mmol/L (ref 22–32)
Calcium: 9 mg/dL (ref 8.9–10.3)
Chloride: 103 mmol/L (ref 98–111)
Creatinine, Ser: 0.92 mg/dL (ref 0.44–1.00)
GFR, Estimated: >60 mL/min (ref >60)
Glucose, Bld: 104 mg/dL — ABNORMAL HIGH (ref 70–99)
Potassium: 5 mmol/L (ref 3.5–5.1)
Sodium: 138 mmol/L (ref 135–145)
Total Bilirubin: 1.1 mg/dL (ref 0.0–1.2)
Total Protein: 8.2 g/dL — ABNORMAL HIGH (ref 6.5–8.1)

## 2023-06-15 LAB — URINALYSIS, ROUTINE W REFLEX MICROSCOPIC

## 2023-06-15 LAB — URINALYSIS, MICROSCOPIC (REFLEX)

## 2023-06-15 LAB — LIPASE, BLOOD: Lipase: 25 U/L (ref 11–51)

## 2023-06-15 MED ORDER — OXYCODONE-ACETAMINOPHEN 5-325 MG PO TABS
1.0000 | ORAL_TABLET | Freq: Once | ORAL | Status: AC
  Administered 2023-06-15: 1 via ORAL
  Filled 2023-06-15: qty 1

## 2023-06-15 MED ORDER — CIPROFLOXACIN HCL 500 MG PO TABS
250.0000 mg | ORAL_TABLET | Freq: Two times a day (BID) | ORAL | 0 refills | Status: AC
Start: 2023-06-15 — End: ?

## 2023-06-15 NOTE — ED Triage Notes (Signed)
 R flank pain/abd pain for 3 days. Hematuria, dysuria and "doesn't feel like she's emptying bladder fully"   Denies NVD, constipation, fevers, SHOB

## 2023-06-15 NOTE — Discharge Instructions (Signed)
 Today you are seen for a UTI.  Please pick up your antibiotic and take as prescribed.  Thank you for letting us treat you today. After reviewing your labs and imaging, I feel you are safe to go home. Please follow up with your PCP in the next several days and provide them with your records from this visit. Return to the Emergency Room if pain becomes severe or symptoms worsen.

## 2023-06-15 NOTE — ED Provider Notes (Signed)
 [place] [facility] Provider Note   CSN: [id number] Arrival date & time: 06/15/23  1031     History  Chief Complaint  Patient presents with   Flank Pain   Abdominal Pain    Amy Mathis is a 43 y.o. female past medical history given for kidney stones and lupus nephritis presents today for 3 days of right flank pain.  Patient also endorses chills and hematuria.  Patient denies dysuria, frequency, fever, nausea, or vomiting.  She notes that the pain radiates from her right flank around to her lower abdomen   Flank Pain Associated symptoms include abdominal pain.  Abdominal Pain      Home Medications Prior to Admission medications   Medication Sig Start Date End Date Taking? Authorizing Provider  ciprofloxacin (CIPRO) 500 MG tablet Take 0.5 tablets (250 mg total) by mouth every 12 (twelve) hours. 06/15/23  Yes Dolphus Jenny, PA-C  gabapentin (NEURONTIN) 100 MG capsule Take 1 capsule (100 mg total) by mouth 3 (three) times daily for 10 days. 12/06/22 12/16/22  Arthor Captain, PA-C  hydroxychloroquine (PLAQUENIL) 200 MG tablet Take by mouth 2 (two) times daily.    [provider]  oxyCODONE (ROXICODONE) 5 MG immediate release tablet Take 1 tablet (5 mg total) by mouth every 8 (eight) hours as needed for up to 10 doses for severe pain. 10/03/22   Smoot, Shawn Route, PA-C  pantoprazole (PROTONIX) 20 MG tablet Take 2 tablets (40 mg total) by mouth daily. 09/16/22   Alvira Monday, MD  promethazine (PHENERGAN) 25 MG tablet Take 1 tablet (25 mg total) by mouth every 6 (six) hours as needed for nausea or vomiting. 09/14/22   Renne Crigler, PA-C  valACYclovir (VALTREX) 1000 MG tablet Take 1 tablet (1,000 mg total) by mouth 3 (three) times daily. 12/06/22   Arthor Captain, PA-C      Allergies    Morphine, Morphine and codeine, Penicillin g, Penicillins, Tramadol, Meperidine hcl, and Nsaids    Review of Systems   Review of Systems   Gastrointestinal:  Positive for abdominal pain.  Genitourinary:  Positive for flank pain.    Physical Exam Updated Vital Signs BP (!) 139/104   Pulse 83   Temp 98.4 F (36.9 C)   Resp 20   Ht 5\' 8"  (1.727 m)   Wt (!) 156.5 kg   SpO2 100%   BMI 52.46 kg/m  Physical Exam Vitals and nursing note reviewed.  Constitutional:      General: She is not in acute distress.    Appearance: She is well-developed. She is obese. She is not ill-appearing, toxic-appearing or diaphoretic.     Comments: Uncomfortable appearing  HENT:     Head: Normocephalic and atraumatic.  Eyes:     Extraocular Movements: Extraocular movements intact.     Conjunctiva/sclera: Conjunctivae normal.  Cardiovascular:     Rate and Rhythm: Normal rate and regular rhythm.     Heart sounds: Normal heart sounds. No murmur heard. Pulmonary:     Effort: Pulmonary effort is normal. No respiratory distress.     Breath sounds: Normal breath sounds.  Abdominal:     General: Bowel sounds are normal. There is no distension.     Palpations: Abdomen is soft.     Tenderness: There is no abdominal tenderness. There is right CVA tenderness. There is no left CVA tenderness, guarding or rebound.  Musculoskeletal:        General: No swelling.     Cervical  back: Neck supple.  Skin:    General: Skin is warm and dry.     Capillary Refill: Capillary refill takes less than 2 seconds.  Neurological:     General: No focal deficit present.     Mental Status: She is alert.  Psychiatric:        Mood and Affect: Mood normal.     ED Results / Procedures / Treatments   Labs (all labs ordered are listed, but only abnormal results are displayed) Labs Reviewed  URINALYSIS, ROUTINE W REFLEX MICROSCOPIC - Abnormal; Notable for the following components:      Result Value   Color, Urine RED (*)    APPearance TURBID (*)    Glucose, UA   (*)    Value: TEST NOT REPORTED DUE TO COLOR INTERFERENCE OF URINE PIGMENT   Hgb urine dipstick    (*)    Value: TEST NOT REPORTED DUE TO COLOR INTERFERENCE OF URINE PIGMENT   Bilirubin Urine   (*)    Value: TEST NOT REPORTED DUE TO COLOR INTERFERENCE OF URINE PIGMENT   Ketones, ur   (*)    Value: TEST NOT REPORTED DUE TO COLOR INTERFERENCE OF URINE PIGMENT   Protein, ur   (*)    Value: TEST NOT REPORTED DUE TO COLOR INTERFERENCE OF URINE PIGMENT   Nitrite   (*)    Value: TEST NOT REPORTED DUE TO COLOR INTERFERENCE OF URINE PIGMENT   Leukocytes,Ua   (*)    Value: TEST NOT REPORTED DUE TO COLOR INTERFERENCE OF URINE PIGMENT   All other components within normal limits  COMPREHENSIVE METABOLIC PANEL WITH GFR - Abnormal; Notable for the following components:   Glucose, Bld 104 (*)    Total Protein 8.2 (*)    All other components within normal limits  URINALYSIS, MICROSCOPIC (REFLEX) - Abnormal; Notable for the following components:   Bacteria, UA MANY (*)    All other components within normal limits  URINE CULTURE  LIPASE, BLOOD  CBC WITH DIFFERENTIAL/PLATELET    EKG None  Radiology CT Renal Stone Study Result Date: 06/15/2023 CLINICAL DATA:  Abdominal/flank pain, stone suspected EXAM: CT ABDOMEN AND PELVIS WITHOUT CONTRAST TECHNIQUE: Multidetector CT imaging of the abdomen and pelvis was performed following the standard protocol without IV contrast. RADIATION DOSE REDUCTION: This exam was performed according to the departmental dose-optimization program which includes automated exposure control, adjustment of the mA and/or kV according to patient size and/or use of iterative reconstruction technique. COMPARISON:  January 02, 2023 FINDINGS: Evaluation is limited by lack of IV contrast. Lower chest: There is a LEFT lower lobe centrilobular area of ground-glass opacities. Hepatobiliary: Hepatic steatosis. Status post cholecystectomy. Similar prominence of the common bile duct. Pancreas: No peripancreatic fat stranding. The head and body of the pancreas is relatively hypoattenuating.  Evaluation is limited secondary lack of IV contrast. Spleen: Unremarkable. Adrenals/Urinary Tract: Adrenal glands are unremarkable. No hydronephrosis. No obstructing nephrolithiasis. Punctate nonobstructing LEFT-sided nephrolithiasis. Bladder is unremarkable for degree of distension. Stomach/Bowel: No evidence of bowel obstruction. No ancillary evidence of acute appendicitis. Stomach is unremarkable for degree of distension. Vascular/Lymphatic: No significant vascular findings are present. No enlarged abdominal or pelvic lymph nodes. Reproductive: Status post hysterectomy. No adnexal masses. Other: No free air or free fluid. Musculoskeletal: Curvilinear sclerosis of the femoral heads may reflect early avascular necrosis. IMPRESSION: 1. There is a LEFT lower lobe centrilobular area of ground-glass opacities. This is favored to reflect an infectious or inflammatory process. 2. The head and  body of the pancreas is relatively hypoattenuating. Evaluation is limited secondary lack of IV contrast. Recommend correlation with lipase levels to assess for acute pancreatitis. Differential considerations include fatty infiltration or less likely infiltrating mass. 3. Hepatic steatosis. 4. Curvilinear sclerosis of the femoral heads may reflect early avascular necrosis. Electronically Signed   By: Meda Klinefelter M.D.   On: 06/15/2023 12:55    Procedures Procedures    Medications Ordered in ED Medications  oxyCODONE-acetaminophen (PERCOCET/ROXICET) 5-325 MG per tablet 1 tablet (1 tablet Oral Given 06/15/23 1242)    ED Course/ Medical Decision Making/ A&P                                 Medical Decision Making Amount and/or Complexity of Data Reviewed Labs: ordered. Radiology: ordered.  Risk Prescription drug management.   This patient presents to the ED for concern of flank pain differential diagnosis includes UTI, kidney stone, lupus nephritis    Additional history obtained:  External records from  outside source obtained and reviewed including Care Everywhere notes   Lab Tests:  I Ordered, and personally interpreted labs.  The pertinent results include:  UA: Red, many bacteria, 6-10 WBCs, many values not reported due to color interference   Imaging Studies ordered:  I ordered imaging studies including CT renal stone study I independently visualized and interpreted imaging which showed . There is a LEFT lower lobe centrilobular area of ground-glass opacities. This is favored to reflect an infectious or inflammatory process. The head and body of the pancreas is relatively hypoattenuating. Evaluation is limited secondary lack of IV contrast. Recommend correlation with lipase levels to assess for acute pancreatitis. Differential considerations include fatty infiltration or less likely infiltrating mass. Hepatic steatosis. Curvilinear sclerosis of the femoral heads may reflect early avascular necrosis. I agree with the radiologist interpretation   Medicines ordered and prescription drug management:  I ordered medication including Percocet for pain Reevaluation of the patient after these medicines showed that the patient improved I have reviewed the patients home medicines and have made adjustments as needed   Consider for mission further workup however patient's vital signs, physical exam, labs, and imaging were reassuring.  Patient symptoms likely due to UTI.  Given that patient is allergic to amoxicillin and has never taken a cephalosporin before, I have prescribed a short course of ciprofloxacin for treatment.  Patient should follow-up with her primary care if her symptoms persist for further evaluation and treatment.           Final Clinical Impression(s) / ED Diagnoses Final diagnoses:  Urinary tract infection with hematuria, site unspecified    Rx / DC Orders ED Discharge Orders          Ordered    ciprofloxacin (CIPRO) 500 MG tablet  Every 12 hours        06/15/23  1415              Dolphus Jenny, PA-C 06/15/23 1419    Estelle June A, DO 06/16/23 [phone]

## 2023-06-16 LAB — URINE CULTURE: Culture: NO GROWTH

## 2023-09-06 ENCOUNTER — Other Ambulatory Visit: Payer: Self-pay

## 2023-09-06 ENCOUNTER — Emergency Department (HOSPITAL_BASED_OUTPATIENT_CLINIC_OR_DEPARTMENT_OTHER)
Admission: EM | Admit: 2023-09-06 | Discharge: 2023-09-06 | Disposition: A | Discharge status: Home / Self Care | Attending: Emergency Medicine | Admitting: Emergency Medicine

## 2023-09-06 ENCOUNTER — Encounter (HOSPITAL_BASED_OUTPATIENT_CLINIC_OR_DEPARTMENT_OTHER): Payer: Self-pay | Admitting: Urology

## 2023-09-06 ENCOUNTER — Emergency Department (HOSPITAL_BASED_OUTPATIENT_CLINIC_OR_DEPARTMENT_OTHER)

## 2023-09-06 DIAGNOSIS — R1031 Right lower quadrant pain: Secondary | ICD-10-CM | POA: Diagnosis present

## 2023-09-06 DIAGNOSIS — R112 Nausea with vomiting, unspecified: Secondary | ICD-10-CM | POA: Insufficient documentation

## 2023-09-06 DIAGNOSIS — R109 Unspecified abdominal pain: Secondary | ICD-10-CM

## 2023-09-06 LAB — COMPREHENSIVE METABOLIC PANEL WITH GFR
ALT: 26 U/L (ref 0–44)
AST: 32 U/L (ref 15–41)
Albumin: 4.2 g/dL (ref 3.5–5.0)
Alkaline Phosphatase: 71 U/L (ref 38–126)
Anion gap: 12 (ref 5–15)
BUN: 6 mg/dL (ref 6–20)
CO2: 24 mmol/L (ref 22–32)
Calcium: 9.6 mg/dL (ref 8.9–10.3)
Chloride: 102 mmol/L (ref 98–111)
Creatinine, Ser: 0.77 mg/dL (ref 0.44–1.00)
GFR, Estimated: >60 mL/min (ref >60)
Glucose, Bld: 89 mg/dL (ref 70–99)
Potassium: 4.3 mmol/L (ref 3.5–5.1)
Sodium: 139 mmol/L (ref 135–145)
Total Bilirubin: 0.4 mg/dL (ref 0.0–1.2)
Total Protein: 8 g/dL (ref 6.5–8.1)

## 2023-09-06 LAB — URINALYSIS, MICROSCOPIC (REFLEX)
Bacteria, UA: NONE SEEN
RBC / HPF: >50 RBC/hpf (ref 0–5)

## 2023-09-06 LAB — CBC WITH DIFFERENTIAL/PLATELET
Abs Immature Granulocytes: 0.03 10*3/uL (ref 0.00–0.07)
Basophils Absolute: 0.1 10*3/uL (ref 0.0–0.1)
Basophils Relative: 1 %
Eosinophils Absolute: 0.1 10*3/uL (ref 0.0–0.5)
Eosinophils Relative: 1 %
HCT: 38.1 % (ref 36.0–46.0)
Hemoglobin: 13.5 g/dL (ref 12.0–15.0)
Immature Granulocytes: 0 %
Lymphocytes Relative: 31 %
Lymphs Abs: 3.2 10*3/uL (ref 0.7–4.0)
MCH: 28.7 pg (ref 26.0–34.0)
MCHC: 35.4 g/dL (ref 30.0–36.0)
MCV: 81.1 fL (ref 80.0–100.0)
Monocytes Absolute: 0.6 10*3/uL (ref 0.1–1.0)
Monocytes Relative: 6 %
Neutro Abs: 6.4 10*3/uL (ref 1.7–7.7)
Neutrophils Relative %: 61 %
Platelets: 350 10*3/uL (ref 150–400)
RBC: 4.7 MIL/uL (ref 3.87–5.11)
RDW: 14.1 % (ref 11.5–15.5)
WBC: 10.4 10*3/uL (ref 4.0–10.5)
nRBC: 0 % (ref 0.0–0.2)

## 2023-09-06 LAB — URINALYSIS, ROUTINE W REFLEX MICROSCOPIC

## 2023-09-06 LAB — LIPASE, BLOOD: Lipase: 21 U/L (ref 11–51)

## 2023-09-06 MED ORDER — IOHEXOL 300 MG/ML  SOLN
100.0000 mL | Freq: Once | INTRAMUSCULAR | Status: AC | PRN
  Administered 2023-09-06: 100 mL via INTRAVENOUS

## 2023-09-06 MED ORDER — HYDROMORPHONE HCL 1 MG/ML IJ SOLN
0.5000 mg | Freq: Once | INTRAMUSCULAR | Status: AC
  Administered 2023-09-06: 0.5 mg via INTRAVENOUS
  Filled 2023-09-06: qty 1

## 2023-09-06 MED ORDER — ONDANSETRON HCL 4 MG/2ML IJ SOLN
4.0000 mg | Freq: Once | INTRAMUSCULAR | Status: AC
  Administered 2023-09-06: 4 mg via INTRAVENOUS
  Filled 2023-09-06: qty 2

## 2023-09-06 MED ORDER — ONDANSETRON HCL 4 MG PO TABS: 4.0000 mg | ORAL_TABLET | Freq: Four times a day (QID) | ORAL | 0 refills | Status: AC

## 2023-09-06 MED ORDER — METHOCARBAMOL 500 MG PO TABS: 500.0000 mg | ORAL_TABLET | Freq: Two times a day (BID) | ORAL | 0 refills | Status: AC

## 2023-09-06 MED ORDER — HYDROMORPHONE HCL 1 MG/ML IJ SOLN
1.0000 mg | Freq: Once | INTRAMUSCULAR | Status: AC
  Administered 2023-09-06: 1 mg via INTRAVENOUS
  Filled 2023-09-06: qty 1

## 2023-09-06 NOTE — Discharge Instructions (Addendum)
 You were seen today for right-sided flank pain and right lower quadrant abdominal pain, your labs, imaging, physical exam were reassuring that I low suspicion for any emergent cause of your symptoms today.  Will have you continue to follow-up with your urologist as well as with your primary care doctor to continue to evaluate the blood in your urine as well as to manage lupus and any other pain that you might be experiencing.  With the pain presentation, and reassuring labs and imaging, suspect this might be muscle strain related, or lupus related.  I am sending in a muscle relaxer which you can use on top of your pain medication that you are already prescribed at home.  I am also prescribing you the Zofran  that we talked about earlier as additional nausea medications to use as needed.  Please take Robaxin , 500 mg up to twice a day as needed for muscle spasm, this is a muscle relaxer, it may cause generalized weakness, sleepiness and you should not drive or do important things while taking this medication.   Return to the ED if you begin to have any new or worsening symptoms which include chest pain, shortness of breath, blood in stool, persistent vomiting, uncontrollable pain

## 2023-09-06 NOTE — ED Notes (Addendum)
Pt is still unable to provide UA.

## 2023-09-06 NOTE — ED Notes (Signed)
 Called Lab to ask about lab

## 2023-09-06 NOTE — ED Notes (Signed)
 Pt unable to provide UA at this time. Aware a specimen is needed.

## 2023-09-06 NOTE — ED Triage Notes (Signed)
 Pt states right side flank pain x 3 days  Dysuria noted as well    H/o kidney stones and lupus

## 2023-09-06 NOTE — ED Notes (Signed)
 Called lab to inquire about delay of lab results, lab did not see order so running it now. EDP aware

## 2023-09-06 NOTE — ED Provider Notes (Signed)
 [place] [facility] Provider Note   CSN: [id number] Arrival date & time: 09/06/23  1408     Patient presents with: Flank Pain   Amy Mathis is a 43 y.o. female.    Flank Pain Associated symptoms include abdominal pain.  Patient is a 43 year old female presents to the ED today with complaints of right flank pain and right lower quadrant abdominal pain x 3 days accompanied with nausea and vomiting.  Noted to have a past medical history of lupus, nephrolithiasis, IBS, chronic hematuria.  States that she is currently being worked up for the chronic hematuria with follow-up for cystoscopy on 09/18/2023.  Patient suspects that this might be a pyelonephritis as this feels similar to when she has had this in the past.  Reports that she always is noted to have a UTI on UA due to the hematuria and bacteria seen on UA however notes that the culture always comes back negative.  Notes that she did finish her last course of ciprofloxacin  provided in March.  Denies fever, headache, vision changes, chest pain, shortness of breath, dysuria, melena, hematochezia, diarrhea, lower leg swelling.     Prior to Admission medications   Medication Sig Start Date End Date Taking? Authorizing Provider  methocarbamol  (ROBAXIN ) 500 MG tablet Take 1 tablet (500 mg total) by mouth 2 (two) times daily. 09/06/23  Yes Beola Terrall RAMAN, PA-C  ondansetron  (ZOFRAN ) 4 MG tablet Take 1 tablet (4 mg total) by mouth every 6 (six) hours. 09/06/23  Yes Ondre Salvetti S, PA-C  ciprofloxacin  (CIPRO ) 500 MG tablet Take 0.5 tablets (250 mg total) by mouth every 12 (twelve) hours. 06/15/23   Keith, Kayla N, PA-C  gabapentin  (NEURONTIN ) 100 MG capsule Take 1 capsule (100 mg total) by mouth 3 (three) times daily for 10 days. 12/06/22 12/16/22  Harris, Abigail, PA-C  hydroxychloroquine (PLAQUENIL) 200 MG tablet Take by mouth 2 (two) times daily.    [provider]  oxyCODONE  (ROXICODONE ) 5 MG  immediate release tablet Take 1 tablet (5 mg total) by mouth every 8 (eight) hours as needed for up to 10 doses for severe pain. 10/03/22   Smoot, Lauraine LABOR, PA-C  pantoprazole  (PROTONIX ) 20 MG tablet Take 2 tablets (40 mg total) by mouth daily. 09/16/22   Dreama Longs, MD  promethazine  (PHENERGAN ) 25 MG tablet Take 1 tablet (25 mg total) by mouth every 6 (six) hours as needed for nausea or vomiting. 09/14/22   Desiderio Chew, PA-C  valACYclovir  (VALTREX ) 1000 MG tablet Take 1 tablet (1,000 mg total) by mouth 3 (three) times daily. 12/06/22   Harris, Abigail, PA-C    Allergies: Morphine, Morphine and codeine, Penicillin g, Penicillins, Tramadol, Meperidine hcl, and Nsaids    Review of Systems  Gastrointestinal:  Positive for abdominal pain, nausea and vomiting.  Genitourinary:  Positive for flank pain.  All other systems reviewed and are negative.   Updated Vital Signs BP 95/63 (BP Location: Right Arm)   Pulse 83   Temp 98.4 F (36.9 C) (Oral)   Resp 20   Ht 5' 8 (1.727 m)   Wt (!) 156.5 kg   SpO2 100%   BMI 52.46 kg/m   Physical Exam Vitals and nursing note reviewed.  Constitutional:      General: She is not in acute distress.    Appearance: Normal appearance. She is obese. She is ill-appearing.  HENT:     Head: Normocephalic and atraumatic.     Mouth/Throat:  Mouth: Mucous membranes are moist.     Pharynx: Oropharynx is clear. No oropharyngeal exudate or posterior oropharyngeal erythema.   Eyes:     General:        Right eye: No discharge.        Left eye: No discharge.     Extraocular Movements: Extraocular movements intact.     Conjunctiva/sclera: Conjunctivae normal.    Cardiovascular:     Rate and Rhythm: Normal rate and regular rhythm.     Pulses: Normal pulses.     Heart sounds: Normal heart sounds. No murmur heard.    No friction rub. No gallop.  Pulmonary:     Effort: Pulmonary effort is normal. No respiratory distress.     Breath sounds: Normal breath  sounds. No stridor. No wheezing, rhonchi or rales.  Chest:     Chest wall: No tenderness.  Abdominal:     General: Abdomen is flat. There is no distension.     Palpations: Abdomen is soft.     Tenderness: There is abdominal tenderness (Right-sided abdominal tenderness to palpation, noting to also have suprapubic tenderness to palpation). There is right CVA tenderness. There is no left CVA tenderness, guarding or rebound.   Musculoskeletal:     Cervical back: Normal range of motion and neck supple. No rigidity or tenderness.     Right lower leg: No edema.     Left lower leg: No edema.   Skin:    General: Skin is warm and dry.     Findings: No bruising, erythema, lesion or rash.   Neurological:     General: No focal deficit present.     Mental Status: She is alert and oriented to person, place, and time. Mental status is at baseline.     Sensory: No sensory deficit.     Motor: No weakness.   Psychiatric:        Mood and Affect: Mood normal.     (all labs ordered are listed, but only abnormal results are displayed) Labs Reviewed  URINALYSIS, ROUTINE W REFLEX MICROSCOPIC - Abnormal; Notable for the following components:      Result Value   Color, Urine RED (*)    APPearance TURBID (*)    Glucose, UA   (*)    Value: TEST NOT REPORTED DUE TO COLOR INTERFERENCE OF URINE PIGMENT   Hgb urine dipstick   (*)    Value: TEST NOT REPORTED DUE TO COLOR INTERFERENCE OF URINE PIGMENT   Bilirubin Urine   (*)    Value: TEST NOT REPORTED DUE TO COLOR INTERFERENCE OF URINE PIGMENT   Ketones, ur   (*)    Value: TEST NOT REPORTED DUE TO COLOR INTERFERENCE OF URINE PIGMENT   Protein, ur   (*)    Value: TEST NOT REPORTED DUE TO COLOR INTERFERENCE OF URINE PIGMENT   Nitrite   (*)    Value: TEST NOT REPORTED DUE TO COLOR INTERFERENCE OF URINE PIGMENT   Leukocytes,Ua   (*)    Value: TEST NOT REPORTED DUE TO COLOR INTERFERENCE OF URINE PIGMENT   All other components within normal limits  URINE  CULTURE  CBC WITH DIFFERENTIAL/PLATELET  COMPREHENSIVE METABOLIC PANEL WITH GFR  LIPASE, BLOOD  URINALYSIS, MICROSCOPIC (REFLEX)    EKG: None  Radiology: CT ABDOMEN PELVIS W CONTRAST Result Date: 09/06/2023 CLINICAL DATA:  Right-sided flank pain for several days EXAM: CT ABDOMEN AND PELVIS WITH CONTRAST TECHNIQUE: Multidetector CT imaging of the abdomen and pelvis was performed using  the standard protocol following bolus administration of intravenous contrast. RADIATION DOSE REDUCTION: This exam was performed according to the departmental dose-optimization program which includes automated exposure control, adjustment of the mA and/or kV according to patient size and/or use of iterative reconstruction technique. CONTRAST:  OMNIPAQUE  IOHEXOL  300 MG/ML  SOLN COMPARISON:  06/15/2023 FINDINGS: Lower chest: Previously seen changes in the left lower lobe have resolved consistent with a benign etiology. Hepatobiliary: Fatty infiltration of the liver is noted. The gallbladder has been surgically removed. Mild fullness of the biliary ductal system is seen. Pancreas: Unremarkable. No pancreatic ductal dilatation or surrounding inflammatory changes. Spleen: Normal in size without focal abnormality. Adrenals/Urinary Tract: Adrenal glands are within normal limits. Kidneys demonstrate a normal enhancement pattern bilaterally no obstructive changes are seen. The bladder is decompressed. Stomach/Bowel: Scattered fecal material is noted throughout the colon. No obstructive or inflammatory changes are seen. The appendix is not well visualized and may have been surgically removed. No inflammatory changes to suggest appendicitis are seen. Small bowel and stomach are within normal limits. Vascular/Lymphatic: No significant vascular findings are present. No enlarged abdominal or pelvic lymph nodes. Reproductive: Status post hysterectomy. No adnexal masses. Other: No abdominal wall hernia or abnormality. No abdominopelvic  ascites. 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 No acute bony abnormality is noted. IMPRESSION: No acute abnormality noted to correspond with the given clinical history Electronically Signed   By: Oneil Devonshire M.D.   On: 09/06/2023 18:43     Procedures   Medications Ordered in the ED  ondansetron  (ZOFRAN ) injection 4 mg (4 mg Intravenous Given 09/06/23 1615)  HYDROmorphone  (DILAUDID ) injection 0.5 mg (0.5 mg Intravenous Given 09/06/23 1617)  iohexol  (OMNIPAQUE ) 300 MG/ML solution 100 mL (100 mLs Intravenous Contrast Given 09/06/23 1736)  HYDROmorphone  (DILAUDID ) injection 1 mg (1 mg Intravenous Given 09/06/23 1817)                                   Medical Decision Making Amount and/or Complexity of Data Reviewed Labs: ordered.   This patient is a 43 year old female who presents to the ED for concern of right flank pain and right lower quadrant Donnell pain that began suddenly 3 days ago, accompanied with some nausea and vomiting.  Noted that she does have a previous history of hematuria that is chronic, currently being worked up by urology with a scope planned on July third.  On physical exam, patient is in no acute distress, afebrile, alert and orient x 4, speaking in full sentences, nontachycardic.  Patient appears mildly tachypneic with respirations of 24 to 26 a minute.  Right upper quadrant and right lower quadrant both tender to palpation, noting also some suprapubic tenderness to palpation.  Right CVA tenderness also present.  No rashes or erythema noted to skin, no lower leg edema, unremarkable exam otherwise.  Believes mild tachypnea is likely secondary to pain, will provide pain medications, starting with Dilaudid  due to patient's allergies to both morphine as well as Toradol .  Will CT abdomen due to the right sided abdominal pain and right CVA tenderness.  Lab work and CT imaging were unremarkable.  Patient  suspects this might be due to her lupus.  Discussed case with attending, who agree that no further workup is required at this time.  Will have her continue follow-up with urology as well as with PCP for further lupus and urological  management.  She has pain medications at home which she will plan on using.  Will also send in some Zofran  and a muscle relaxer, considering this may be muscle strain related with no other other abnormalities noted on her labs or imaging.  Patient vital signs have remained stable throughout the course of patient's time in the ED. Low suspicion for any other emergent pathology at this time. I believe this patient is safe to be discharged. Provided strict return to ER precautions. Patient expressed agreement and understanding of plan. All questions were answered.  Differential diagnoses prior to evaluation: The emergent differential diagnosis includes, but is not limited to, nephrolithiasis, pyelonephritis, UTI, hepatitis, pancreatitis, diverticulitis, mass. This is not an exhaustive differential.   Past Medical History / Co-morbidities / Social History: Lupus, IBS, nephrolithiasis  Status post cholecystectomy, oophorectomy, hysterectomy  Additional history: Chart reviewed. Pertinent results include:   Noted to have been seen on 06/15/2023 for UTI with hematuria, noted to have many bacteria on UA, treated with ciprofloxacin .  Lab Tests/Imaging studies: I personally interpreted labs/imaging and the pertinent results include:    CBC unremarkable. CMP unremarkable Lipase unremarkable UA shows red, turbid urine and is unable to be evaluated, urine culture pending CT abdomen unremarkable for any new or acute changes  I agree with the radiologist interpretation.    Medications: I ordered medication including Dilaudid , Zofran .  I have reviewed the patients home medicines and have made adjustments as needed.  Critical Interventions: none  Social Determinants of  Health: Patient has good follow-up with, saying that she will follow-up with urology as well as with her PCP for lupus management.  Disposition: After consideration of the diagnostic results and the patients response to treatment, I feel that the patient would benefit from discharge and treatment as above.   emergency department workup does not suggest an emergent condition requiring admission or immediate intervention beyond what has been performed at this time. The plan is: Follow-up with PCP, follow-up with urology, return to ED for new or worsening symptoms, use pain medication already prescribed at home, will prescribe Zofran  for nausea. The patient is safe for discharge and has been instructed to return immediately for worsening symptoms, change in symptoms or any other concerns.   Final diagnoses:  Right flank pain    ED Discharge Orders          Ordered    ondansetron  (ZOFRAN ) 4 MG tablet  Every 6 hours        09/06/23 1936    methocarbamol  (ROBAXIN ) 500 MG tablet  2 times daily        09/06/23 1936               Beola Terrall GORMAN DEVONNA 09/06/23 1948    Ula Prentice SAUNDERS, MD 09/06/23 [phone]

## 2023-09-09 LAB — URINE CULTURE

## 2023-09-11 ENCOUNTER — Other Ambulatory Visit: Payer: Self-pay

## 2023-09-11 ENCOUNTER — Emergency Department (HOSPITAL_BASED_OUTPATIENT_CLINIC_OR_DEPARTMENT_OTHER)

## 2023-09-11 ENCOUNTER — Emergency Department (HOSPITAL_BASED_OUTPATIENT_CLINIC_OR_DEPARTMENT_OTHER): Admission: EM | Admit: 2023-09-11 | Discharge: 2023-09-11 | Disposition: A | Discharge status: Home / Self Care

## 2023-09-11 ENCOUNTER — Encounter (HOSPITAL_BASED_OUTPATIENT_CLINIC_OR_DEPARTMENT_OTHER): Payer: Self-pay | Admitting: Emergency Medicine

## 2023-09-11 DIAGNOSIS — R109 Unspecified abdominal pain: Secondary | ICD-10-CM

## 2023-09-11 DIAGNOSIS — R1011 Right upper quadrant pain: Secondary | ICD-10-CM | POA: Insufficient documentation

## 2023-09-11 LAB — URINALYSIS, ROUTINE W REFLEX MICROSCOPIC

## 2023-09-11 LAB — D-DIMER, QUANTITATIVE: D-Dimer, Quant: <0.27 ug{FEU}/mL (ref 0.00–0.50)

## 2023-09-11 LAB — CBC WITH DIFFERENTIAL/PLATELET
Abs Immature Granulocytes: 0.02 10*3/uL (ref 0.00–0.07)
Basophils Absolute: 0 10*3/uL (ref 0.0–0.1)
Basophils Relative: 0 %
Eosinophils Absolute: 0.2 10*3/uL (ref 0.0–0.5)
Eosinophils Relative: 2 %
HCT: 36.7 % (ref 36.0–46.0)
Hemoglobin: 13.1 g/dL (ref 12.0–15.0)
Immature Granulocytes: 0 %
Lymphocytes Relative: 33 %
Lymphs Abs: 3.1 10*3/uL (ref 0.7–4.0)
MCH: 28.8 pg (ref 26.0–34.0)
MCHC: 35.7 g/dL (ref 30.0–36.0)
MCV: 80.7 fL (ref 80.0–100.0)
Monocytes Absolute: 0.7 10*3/uL (ref 0.1–1.0)
Monocytes Relative: 7 %
Neutro Abs: 5.5 10*3/uL (ref 1.7–7.7)
Neutrophils Relative %: 58 %
Platelets: 335 10*3/uL (ref 150–400)
RBC: 4.55 MIL/uL (ref 3.87–5.11)
RDW: 14 % (ref 11.5–15.5)
WBC: 9.5 10*3/uL (ref 4.0–10.5)
nRBC: 0 % (ref 0.0–0.2)

## 2023-09-11 LAB — COMPREHENSIVE METABOLIC PANEL WITH GFR
ALT: 20 U/L (ref 0–44)
AST: 26 U/L (ref 15–41)
Albumin: 3.7 g/dL (ref 3.5–5.0)
Alkaline Phosphatase: 60 U/L (ref 38–126)
Anion gap: 10 (ref 5–15)
BUN: 7 mg/dL (ref 6–20)
CO2: 24 mmol/L (ref 22–32)
Calcium: 8.6 mg/dL — ABNORMAL LOW (ref 8.9–10.3)
Chloride: 105 mmol/L (ref 98–111)
Creatinine, Ser: 0.74 mg/dL (ref 0.44–1.00)
GFR, Estimated: >60 mL/min (ref >60)
Glucose, Bld: 87 mg/dL (ref 70–99)
Potassium: 4.2 mmol/L (ref 3.5–5.1)
Sodium: 139 mmol/L (ref 135–145)
Total Bilirubin: 0.2 mg/dL (ref 0.0–1.2)
Total Protein: 7.1 g/dL (ref 6.5–8.1)

## 2023-09-11 LAB — LIPASE, BLOOD: Lipase: 20 U/L (ref 11–51)

## 2023-09-11 LAB — URINALYSIS, MICROSCOPIC (REFLEX)

## 2023-09-11 MED ORDER — PANTOPRAZOLE SODIUM 20 MG PO TBEC: 20.0000 mg | DELAYED_RELEASE_TABLET | Freq: Every day | ORAL | 0 refills | Status: AC

## 2023-09-11 MED ORDER — LIDOCAINE 5 % EX PTCH
1.0000 | MEDICATED_PATCH | CUTANEOUS | Status: DC
  Administered 2023-09-11: 1 via TRANSDERMAL
  Filled 2023-09-11: qty 1

## 2023-09-11 MED ORDER — PANTOPRAZOLE SODIUM 40 MG IV SOLR
40.0000 mg | Freq: Once | INTRAVENOUS | Status: AC
  Administered 2023-09-11: 40 mg via INTRAVENOUS
  Filled 2023-09-11: qty 10

## 2023-09-11 MED ORDER — HYDROMORPHONE HCL 1 MG/ML IJ SOLN
0.5000 mg | Freq: Once | INTRAMUSCULAR | Status: AC
  Administered 2023-09-11: 0.5 mg via INTRAVENOUS
  Filled 2023-09-11: qty 1

## 2023-09-11 MED ORDER — LIDOCAINE 5 % EX PTCH
1.0000 | MEDICATED_PATCH | CUTANEOUS | 0 refills | Status: AC
Start: 2023-09-11 — End: ?

## 2023-09-11 MED ORDER — ONDANSETRON HCL 4 MG/2ML IJ SOLN
4.0000 mg | Freq: Once | INTRAMUSCULAR | Status: AC
  Administered 2023-09-11: 4 mg via INTRAVENOUS
  Filled 2023-09-11: qty 2

## 2023-09-11 MED ORDER — HYDROMORPHONE HCL 1 MG/ML IJ SOLN
1.0000 mg | Freq: Once | INTRAMUSCULAR | Status: AC
  Administered 2023-09-11: 1 mg via INTRAVENOUS
  Filled 2023-09-11: qty 1

## 2023-09-11 MED ORDER — SODIUM CHLORIDE 0.9 % IV BOLUS
1000.0000 mL | Freq: Once | INTRAVENOUS | Status: AC
  Administered 2023-09-11: 1000 mL via INTRAVENOUS

## 2023-09-11 MED ORDER — KETOROLAC TROMETHAMINE 15 MG/ML IJ SOLN: 15.0000 mg | Freq: Once | INTRAMUSCULAR | Status: DC

## 2023-09-11 NOTE — ED Notes (Signed)
 Labs drawn off IV at this time to recollect sample. Patient then transported for CT

## 2023-09-11 NOTE — ED Notes (Signed)
 Conner, PA notified of pt pain

## 2023-09-11 NOTE — ED Triage Notes (Signed)
 Right flank pain , was seen on 6/21 for same . Denies urinary symptoms

## 2023-09-11 NOTE — Discharge Instructions (Addendum)
 As we discussed, we did not find anything sinister going on in your abdomen.  Workup was ultimately negative.  This could be peptic ulcer disease or muscular in nature.  I see that you are prescribed chronic pain medication normally so we will proceed with lidocaine  patches and Protonix .  I would like for you to follow-up with your primary care doctor for further evaluation.  You may return to the emergency department for any worsening symptoms.

## 2023-09-11 NOTE — ED Provider Notes (Signed)
 [place] [facility] Provider Note   CSN: [id number] Arrival date & time: 09/11/23  1307     Patient presents with: Flank Pain (Right )   Amy Mathis is a 43 y.o. female patient with history of lupus and chronic hematuria who presents to the emergency department today for further evaluation of right sided flank pain with radiation to the front side of the abdomen.  Patient was seen here on 09/06/2023 for similar symptoms.  She had a CT scan with contrast which was normal.  Patient states she was ultimately discharged and did not feel any better however yesterday the pain got much worse.  She is now endorsing some urinary urgency without any urinary frequency.  She does still suffer from chronic hematuria.  She denies dysuria. No fever or chills.     Flank Pain       Prior to Admission medications   Medication Sig Start Date End Date Taking? Authorizing Provider  lidocaine  (LIDODERM ) 5 % Place 1 patch onto the skin daily. Remove & Discard patch within 12 hours or as directed by MD 09/11/23  Yes Amy, Fannie Alomar M, PA-C  pantoprazole  (PROTONIX ) 20 MG tablet Take 1 tablet (20 mg total) by mouth daily. 09/11/23  Yes Amy, Elmyra Banwart M, PA-C  ciprofloxacin  (CIPRO ) 500 MG tablet Take 0.5 tablets (250 mg total) by mouth every 12 (twelve) hours. 06/15/23   Keith, Kayla N, PA-C  gabapentin  (NEURONTIN ) 100 MG capsule Take 1 capsule (100 mg total) by mouth 3 (three) times daily for 10 days. 12/06/22 12/16/22  Harris, Abigail, PA-C  hydroxychloroquine (PLAQUENIL) 200 MG tablet Take by mouth 2 (two) times daily.    [provider]  methocarbamol  (ROBAXIN ) 500 MG tablet Take 1 tablet (500 mg total) by mouth 2 (two) times daily. 09/06/23   Bauer, Collin S, PA-C  ondansetron  (ZOFRAN ) 4 MG tablet Take 1 tablet (4 mg total) by mouth every 6 (six) hours. 09/06/23   Bauer, Collin S, PA-C  oxyCODONE  (ROXICODONE ) 5 MG immediate release tablet Take 1 tablet (5 mg total)  by mouth every 8 (eight) hours as needed for up to 10 doses for severe pain. 10/03/22   Smoot, Lauraine LABOR, PA-C  promethazine  (PHENERGAN ) 25 MG tablet Take 1 tablet (25 mg total) by mouth every 6 (six) hours as needed for nausea or vomiting. 09/14/22   Desiderio Chew, PA-C  valACYclovir  (VALTREX ) 1000 MG tablet Take 1 tablet (1,000 mg total) by mouth 3 (three) times daily. 12/06/22   Harris, Abigail, PA-C    Allergies: Morphine, Morphine and codeine, Penicillin g, Penicillins, Tramadol, Meperidine hcl, and Nsaids    Review of Systems  Genitourinary:  Positive for flank pain.  All other systems reviewed and are negative.   Updated Vital Signs BP 115/85   Pulse 79   Temp 98.1 F (36.7 C) (Oral)   Resp 18   SpO2 98%   Physical Exam Vitals and nursing note reviewed.  Constitutional:      General: She is not in acute distress.    Appearance: Normal appearance.  HENT:     Head: Normocephalic and atraumatic.   Eyes:     General:        Right eye: No discharge.        Left eye: No discharge.    Cardiovascular:     Comments: Regular rate and rhythm.  S1/S2 are distinct without any evidence of murmur, rubs, or gallops.  Radial pulses are 2+ bilaterally.  Dorsalis pedis pulses are 2+ bilaterally.  No evidence of pedal edema. Pulmonary:     Comments: Clear to auscultation bilaterally.  Normal effort.  No respiratory distress.  No evidence of wheezes, rales, or rhonchi heard throughout. Abdominal:     General: Abdomen is flat. Bowel sounds are normal. There is no distension.     Tenderness: There is abdominal tenderness in the right upper quadrant. There is right CVA tenderness. There is no guarding or rebound.   Musculoskeletal:        General: Normal range of motion.     Cervical back: Neck supple.   Skin:    General: Skin is warm and dry.     Findings: No rash.   Neurological:     General: No focal deficit present.     Mental Status: She is alert.   Psychiatric:        Mood and  Affect: Mood normal.        Behavior: Behavior normal.     (all labs ordered are listed, but only abnormal results are displayed) Labs Reviewed  COMPREHENSIVE METABOLIC PANEL WITH GFR - Abnormal; Notable for the following components:      Result Value   Calcium 8.6 (*)    All other components within normal limits  URINALYSIS, ROUTINE W REFLEX MICROSCOPIC - Abnormal; Notable for the following components:   Color, Urine BROWN (*)    APPearance CLOUDY (*)    Glucose, UA   (*)    Value: TEST NOT REPORTED DUE TO COLOR INTERFERENCE OF URINE PIGMENT   Hgb urine dipstick   (*)    Value: TEST NOT REPORTED DUE TO COLOR INTERFERENCE OF URINE PIGMENT   Bilirubin Urine   (*)    Value: TEST NOT REPORTED DUE TO COLOR INTERFERENCE OF URINE PIGMENT   Ketones, ur   (*)    Value: TEST NOT REPORTED DUE TO COLOR INTERFERENCE OF URINE PIGMENT   Protein, ur   (*)    Value: TEST NOT REPORTED DUE TO COLOR INTERFERENCE OF URINE PIGMENT   Nitrite   (*)    Value: TEST NOT REPORTED DUE TO COLOR INTERFERENCE OF URINE PIGMENT   Leukocytes,Ua   (*)    Value: TEST NOT REPORTED DUE TO COLOR INTERFERENCE OF URINE PIGMENT   All other components within normal limits  URINALYSIS, MICROSCOPIC (REFLEX) - Abnormal; Notable for the following components:   Bacteria, UA MANY (*)    All other components within normal limits  CBC WITH DIFFERENTIAL/PLATELET  LIPASE, BLOOD  D-DIMER, QUANTITATIVE    EKG: None  Radiology: US  Abdomen Limited RUQ (LIVER/GB) Result Date: 09/11/2023 CLINICAL DATA:  History of prior cholecystectomy presenting with right upper quadrant pain and prominent bile ducts on CT. EXAM: ULTRASOUND ABDOMEN LIMITED RIGHT UPPER QUADRANT COMPARISON:  None Available. FINDINGS: Gallbladder: The gallbladder is surgically absent. Common bile duct: Diameter: N/A    The common bile duct is not visualized. Liver: The liver is only visualized at the right kidney interface and is subsequently limited in evaluation.  Increased echogenicity of the liver parenchyma is noted. Other: The study is technically limited secondary to poor acoustics, as per the ultrasound technologist. IMPRESSION: 1. Evidence of prior cholecystectomy. 2. Limited study with nonvisualization of the common bile duct and majority of the liver. Electronically Signed   By: Suzen Dials M.D.   On: 09/11/2023 17:08   CT Renal Stone Study Result Date: 09/11/2023 CLINICAL DATA:  Right flank pain. EXAM: CT ABDOMEN AND PELVIS  WITHOUT CONTRAST TECHNIQUE: Multidetector CT imaging of the abdomen and pelvis was performed following the standard protocol without IV contrast. RADIATION DOSE REDUCTION: This exam was performed according to the departmental dose-optimization program which includes automated exposure control, adjustment of the mA and/or kV according to patient size and/or use of iterative reconstruction technique. COMPARISON:  September 06, 2023 FINDINGS: Lower chest: No acute abnormality. Hepatobiliary: Hepatic steatosis. Status post cholecystectomy. No biliary dilatation. Pancreas: Unremarkable. No pancreatic ductal dilatation or surrounding inflammatory changes. Spleen: Normal in size without focal abnormality. Adrenals/Urinary Tract: Adrenal glands are unremarkable. Kidneys are normal, without renal calculi, focal lesion, or hydronephrosis. Bladder is unremarkable. Stomach/Bowel: Stomach is within normal limits. Appendix appears normal. No evidence of bowel wall thickening, distention, or inflammatory changes. Vascular/Lymphatic: No significant vascular findings are present. No enlarged abdominal or pelvic lymph nodes. Reproductive: Status post hysterectomy. No adnexal masses. Other: No ascites or hernia. Musculoskeletal: No fracture is seen. IMPRESSION: Possible hepatic steatosis. No definite acute abnormality seen in the abdomen or pelvis. Electronically Signed   By: Lynwood Landy Raddle M.D.   On: 09/11/2023 15:21     Procedures   Medications Ordered  in the ED  lidocaine  (LIDODERM ) 5 % 1 patch (1 patch Transdermal Patch Applied 09/11/23 1817)  HYDROmorphone  (DILAUDID ) injection 1 mg (1 mg Intravenous Given 09/11/23 1353)  sodium chloride  0.9 % bolus 1,000 mL (0 mLs Intravenous Stopped 09/11/23 1528)  HYDROmorphone  (DILAUDID ) injection 0.5 mg (0.5 mg Intravenous Given 09/11/23 1502)  HYDROmorphone  (DILAUDID ) injection 0.5 mg (0.5 mg Intravenous Given 09/11/23 1621)  pantoprazole  (PROTONIX ) injection 40 mg (40 mg Intravenous Given 09/11/23 1621)  HYDROmorphone  (DILAUDID ) injection 1 mg (1 mg Intravenous Given 09/11/23 1710)    Clinical Course as of 09/11/23 1832  Thu Sep 11, 2023  1751 D-dimer, quantitative Negative.  [CF]  1751 CBC with Differential Negative.  [CF]  1751 Comprehensive metabolic panel with GFR(!) Negative.  [CF]  1751 CT Renal Stone Study I personally ordered and interpreted the study.  I do not see any evidence of [CF]  1752 CT Renal Stone Study I personally ordered and interpreted this study and do not see any evidence of acute abdomen. I do agree with the radiologist interpretation. [CF]    Clinical Course User Index [CF] Amy Cameron HERO, PA-C    Medical Decision Making Larose Batres is a 43 y.o. female patient who presents to the emergency department today for further evaluation of right-sided flank pain.  Given that the patient's pain has intensified and given the waxing and waning nature of the pain will get this repeat CT scan primary looking for a renal stone.  Will give her some pain medication and some fluids as well.  Labs and imaging are all reassuring.  Do not see any evidence of rashes that make me consider as shingles.  No clinical evidence of pneumonia.  Low suspicion for pulmonary embolism at this time.  I favor muscular pathology.  Patient still having chronic hematuria as shown on labs.  No evidence of kidney stone.  Will treat as well for peptic ulcer disease and for muscular pathology.  Patient has  Robaxin  at home.  Will add on lidocaine  patches and Protonix .  Patient does receive chronic pain medication at home.  She is safe for discharge.  Strict return precautions were discussed.  Amount and/or Complexity of Data Reviewed Labs: ordered. Decision-making details documented in ED Course. Radiology: ordered. Decision-making details documented in ED Course.  Risk Prescription drug management.  Final diagnoses:  Flank pain    ED Discharge Orders          Ordered    pantoprazole  (PROTONIX ) 20 MG tablet  Daily        09/11/23 1824    lidocaine  (LIDODERM ) 5 %  Every 24 hours        09/11/23 1824               Amy Cameron HERO, PA-C 09/11/23 1832    Ula Prentice SAUNDERS, MD 09/12/23 [phone]

## 2023-09-28 ENCOUNTER — Encounter (HOSPITAL_BASED_OUTPATIENT_CLINIC_OR_DEPARTMENT_OTHER): Payer: Self-pay | Admitting: Emergency Medicine

## 2023-09-28 ENCOUNTER — Emergency Department (HOSPITAL_BASED_OUTPATIENT_CLINIC_OR_DEPARTMENT_OTHER)
Admission: EM | Admit: 2023-09-28 | Discharge: 2023-09-28 | Disposition: A | Discharge status: Home / Self Care | Attending: Emergency Medicine | Admitting: Emergency Medicine

## 2023-09-28 DIAGNOSIS — R1011 Right upper quadrant pain: Secondary | ICD-10-CM | POA: Diagnosis present

## 2023-09-28 HISTORY — DX: Hematuria, unspecified: R31.9

## 2023-09-28 LAB — CBC WITH DIFFERENTIAL/PLATELET
Abs Immature Granulocytes: 0.02 K/uL (ref 0.00–0.07)
Basophils Absolute: 0.1 K/uL (ref 0.0–0.1)
Basophils Relative: 1 %
Eosinophils Absolute: 0.1 K/uL (ref 0.0–0.5)
Eosinophils Relative: 1 %
HCT: 37.5 % (ref 36.0–46.0)
Hemoglobin: 13.7 g/dL (ref 12.0–15.0)
Immature Granulocytes: 0 %
Lymphocytes Relative: 32 %
Lymphs Abs: 3.1 K/uL (ref 0.7–4.0)
MCH: 29.5 pg (ref 26.0–34.0)
MCHC: 36.5 g/dL — ABNORMAL HIGH (ref 30.0–36.0)
MCV: 80.8 fL (ref 80.0–100.0)
Monocytes Absolute: 0.6 K/uL (ref 0.1–1.0)
Monocytes Relative: 6 %
Neutro Abs: 6 K/uL (ref 1.7–7.7)
Neutrophils Relative %: 60 %
Platelets: 373 K/uL (ref 150–400)
RBC: 4.64 MIL/uL (ref 3.87–5.11)
RDW: 14.2 % (ref 11.5–15.5)
WBC: 9.9 K/uL (ref 4.0–10.5)
nRBC: 0 % (ref 0.0–0.2)

## 2023-09-28 LAB — COMPREHENSIVE METABOLIC PANEL WITH GFR
ALT: 19 U/L (ref 0–44)
AST: 28 U/L (ref 15–41)
Albumin: 4.1 g/dL (ref 3.5–5.0)
Alkaline Phosphatase: 72 U/L (ref 38–126)
Anion gap: 13 (ref 5–15)
BUN: 7 mg/dL (ref 6–20)
CO2: 25 mmol/L (ref 22–32)
Calcium: 9.4 mg/dL (ref 8.9–10.3)
Chloride: 102 mmol/L (ref 98–111)
Creatinine, Ser: 0.75 mg/dL (ref 0.44–1.00)
GFR, Estimated: >60 mL/min (ref >60)
Glucose, Bld: 93 mg/dL (ref 70–99)
Potassium: 4.1 mmol/L (ref 3.5–5.1)
Sodium: 140 mmol/L (ref 135–145)
Total Bilirubin: 0.4 mg/dL (ref 0.0–1.2)
Total Protein: 8.2 g/dL — ABNORMAL HIGH (ref 6.5–8.1)

## 2023-09-28 LAB — HCG, SERUM, QUALITATIVE: Preg, Serum: NEGATIVE

## 2023-09-28 LAB — LIPASE, BLOOD: Lipase: 18 U/L (ref 11–51)

## 2023-09-28 MED ORDER — FENTANYL CITRATE PF 50 MCG/ML IJ SOSY
50.0000 ug | PREFILLED_SYRINGE | Freq: Once | INTRAMUSCULAR | Status: AC
  Administered 2023-09-28: 50 ug via INTRAVENOUS
  Filled 2023-09-28: qty 1

## 2023-09-28 MED ORDER — OXYCODONE-ACETAMINOPHEN 5-325 MG PO TABS
1.0000 | ORAL_TABLET | Freq: Once | ORAL | Status: AC
  Administered 2023-09-28: 1 via ORAL
  Filled 2023-09-28: qty 1

## 2023-09-28 MED ORDER — FENTANYL CITRATE PF 50 MCG/ML IJ SOSY: 50.0000 ug | PREFILLED_SYRINGE | Freq: Once | INTRAMUSCULAR | Status: DC

## 2023-09-28 MED ORDER — KETOROLAC TROMETHAMINE 15 MG/ML IJ SOLN: 15.0000 mg | Freq: Once | INTRAMUSCULAR | Status: DC

## 2023-09-28 NOTE — ED Notes (Signed)
 Reviewed discharge instructions with pt. Pt states understanding. Waiting on husband to pick her up. Pt in agreement to wait in lobby until husband arrives. Ambulatory at discharge

## 2023-09-28 NOTE — ED Triage Notes (Signed)
 Pt c/o RT flank and RUQ pain; sts the pain goes from the back to front

## 2023-09-28 NOTE — Discharge Instructions (Addendum)
 Your kidney, pancreas, and liver labs are normal today.  Your blood counts do not show any signs of infection.  As discussed, please follow-up with your PCP for further care.  A GI referral or OB/GYN referral may be beneficial from your PCP for further evaluation into your pain.  Please return to the emergency room for any uncontrolled pain, uncontrolled vomiting, fevers, any other new or concerning symptoms

## 2023-09-28 NOTE — ED Provider Notes (Signed)
 [place] [facility] Provider Note   CSN: [id number] Arrival date & time: 09/28/23  1410     Patient presents with: Abdominal Pain   Amy Mathis is a 43 y.o. female with history of endometriosis with hysterectomy, IBS, lupus, hematuria, presents with concern for right lower back pain as well as right sided abdominal pain.  States this flareup started a couple days ago, but acutely worsened today.  This episode is similar to the previous pain she has been evaluated for multiple times in the emergency room.  Still reports chronic hematuria, but denies any dysuria or increased frequency.  Denying any fever, chills, nausea, or vomiting. Reports chronic diarrhea from her IBS.   This concern and presentation is the same as previous visits and previous times I have evaluated patient.  Patient worked up for this concern multiple times in the ER over the past couple of weeks    Abdominal Pain      Prior to Admission medications   Medication Sig Start Date End Date Taking? Authorizing Provider  ciprofloxacin  (CIPRO ) 500 MG tablet Take 0.5 tablets (250 mg total) by mouth every 12 (twelve) hours. 06/15/23   Keith, Kayla N, PA-C  gabapentin  (NEURONTIN ) 100 MG capsule Take 1 capsule (100 mg total) by mouth 3 (three) times daily for 10 days. 12/06/22 12/16/22  Harris, Abigail, PA-C  hydroxychloroquine (PLAQUENIL) 200 MG tablet Take by mouth 2 (two) times daily.    [provider]  lidocaine  (LIDODERM ) 5 % Place 1 patch onto the skin daily. Remove & Discard patch within 12 hours or as directed by MD 09/11/23   Theotis Cameron HERO, PA-C  methocarbamol  (ROBAXIN ) 500 MG tablet Take 1 tablet (500 mg total) by mouth 2 (two) times daily. 09/06/23   Bauer, Collin S, PA-C  ondansetron  (ZOFRAN ) 4 MG tablet Take 1 tablet (4 mg total) by mouth every 6 (six) hours. 09/06/23   Bauer, Collin S, PA-C  oxyCODONE  (ROXICODONE ) 5 MG immediate release tablet Take 1 tablet (5 mg  total) by mouth every 8 (eight) hours as needed for up to 10 doses for severe pain. 10/03/22   Smoot, Lauraine LABOR, PA-C  pantoprazole  (PROTONIX ) 20 MG tablet Take 1 tablet (20 mg total) by mouth daily. 09/11/23   Theotis Cameron HERO, PA-C  promethazine  (PHENERGAN ) 25 MG tablet Take 1 tablet (25 mg total) by mouth every 6 (six) hours as needed for nausea or vomiting. 09/14/22   Desiderio Chew, PA-C  valACYclovir  (VALTREX ) 1000 MG tablet Take 1 tablet (1,000 mg total) by mouth 3 (three) times daily. 12/06/22   Harris, Abigail, PA-C    Allergies: Morphine, Morphine and codeine, Penicillin g, Penicillins, Tramadol, Meperidine hcl, and Nsaids    Review of Systems  Gastrointestinal:  Positive for abdominal pain.    Updated Vital Signs BP (!) 120/97   Pulse 68   Temp 98 F (36.7 C)   Resp 18   Ht 5' 8 (1.727 m)   Wt (!) 162.4 kg   SpO2 99%   BMI 54.43 kg/m   Physical Exam Vitals and nursing note reviewed.  Constitutional:      General: She is not in acute distress.    Appearance: She is well-developed. She is obese.     Comments: Appears in pain  HENT:     Head: Normocephalic and atraumatic.  Eyes:     Conjunctiva/sclera: Conjunctivae normal.  Cardiovascular:     Rate and Rhythm: Normal rate and regular rhythm.  Heart sounds: No murmur heard. Pulmonary:     Effort: Pulmonary effort is normal. No respiratory distress.     Breath sounds: Normal breath sounds.  Abdominal:     Palpations: Abdomen is soft.     Tenderness: There is abdominal tenderness.     Comments: Mild tenderness palpation in the right upper abdomen.  No rebound or guarding.  No CVA tenderness bilaterally.  Musculoskeletal:        General: No swelling.     Cervical back: Neck supple.  Skin:    General: Skin is warm and dry.     Capillary Refill: Capillary refill takes less than 2 seconds.  Neurological:     Mental Status: She is alert.  Psychiatric:        Mood and Affect: Mood normal.     (all labs ordered  are listed, but only abnormal results are displayed) Labs Reviewed  CBC WITH DIFFERENTIAL/PLATELET - Abnormal; Notable for the following components:      Result Value   MCHC 36.5 (*)    All other components within normal limits  COMPREHENSIVE METABOLIC PANEL WITH GFR - Abnormal; Notable for the following components:   Total Protein 8.2 (*)    All other components within normal limits  LIPASE, BLOOD  HCG, SERUM, QUALITATIVE    EKG: None  Radiology: No results found.   Procedures   Medications Ordered in the ED  oxyCODONE -acetaminophen  (PERCOCET/ROXICET) 5-325 MG per tablet 1 tablet (has no administration in time range)  fentaNYL  (SUBLIMAZE ) injection 50 mcg (50 mcg Intravenous Given 09/28/23 1741)                                    Medical Decision Making Amount and/or Complexity of Data Reviewed Labs: ordered.  Risk Prescription drug management.     Differential diagnosis includes but is not limited to endometriosis, Acute cholecystitis, cholelithiasis, cholangitis, choledocholithiasis, peptic ulcer, gastritis, gastroenteritis, appendicitis, IBS, IBD, DKA, nephrolithiasis, UTI, pyelonephritis, pancreatitis, diverticulitis, mesenteric ischemia, abdominal aortic aneurysm, small bowel obstruction, volvulus   ED Course:  Upon initial evaluation, patient appears uncomfortable due to pain.  Mild abdominal tenderness in the right upper quadrant without rebound or guarding.  She does not have any CVA tenderness bilaterally.  Patient request Dilaudid  by name which I declined to give at this time given this seems to be chronic pain.  Will start with IM fentanyl   Labs Ordered: I Ordered, and personally interpreted labs.  The pertinent results include:   CBC without leukocytosis CMP without any elevation in LFTs or creatinine.  Normal electrolytes Lipase within normal limits Pregnancy negative   Medications Given: Fentanyl , Percocet  Upon re-evaluation, patient reports  pain better controlled with the fentanyl  given today.  She is resting comfortably.  Vital signs stable.  She was unable to provide urinalysis, but patient denying any urinary symptoms such as dysuria, increased frequency, or suprapubic pain.  She has chronic hematuria which is well-known.  Low concern for UTI or pyelonephritis at this time.  Her labs are unremarkable without any leukocytosis, no elevation creatinine, LFTs, or lipase.  Pain flare is consistent with all previous flares.  She did have CT abdomen pelvis imaging, CT renal stone study last month for the same concern without any abnormalities to explain her pain.  I do not feel we need to obtain any further imaging at this time given no change in symptoms, reassuring labs and vitals.  This seems consistent with her chronic pain.  Question if this could be endometriosis flares. Will give dose of home prior to discharge.  Stable and appropriate for discharge home at this time    Impression: Chronic abdominal pain  Disposition:  The patient was discharged home with instructions to follow-up with PCP pain management.  Encouraged her to obtain GI and OB/GYN referral/follow-up for further workup of her pain. Return precautions given.    Record Review: External records from outside source obtained and reviewed including multiple previous ER visits for the same concern on 09/11/2023, 09/06/2023, 06/15/2023.  Reviewed CT imaging from these visits     This chart was dictated using voice recognition software, Dragon. Despite the best efforts of this provider to proofread and correct errors, errors may still occur which can change documentation meaning.       Final diagnoses:  Right upper quadrant abdominal pain    ED Discharge Orders     None          Veta Palma, PA-C 09/28/23 1814    Ruthe Cornet, DO 09/28/23 1820

## 2023-09-28 NOTE — ED Notes (Signed)
 Unable to obtain labs and urine at this time, pt states she needs an ultrasound guided IV, and unable to provide urine at this time. Triage RN aware.

## 2023-11-23 ENCOUNTER — Other Ambulatory Visit: Payer: Self-pay

## 2023-11-23 ENCOUNTER — Emergency Department (HOSPITAL_BASED_OUTPATIENT_CLINIC_OR_DEPARTMENT_OTHER): Admission: EM | Admit: 2023-11-23 | Discharge: 2023-11-23 | Discharge status: Against Medical Advice

## 2023-11-23 ENCOUNTER — Encounter (HOSPITAL_BASED_OUTPATIENT_CLINIC_OR_DEPARTMENT_OTHER): Payer: Self-pay

## 2023-11-23 DIAGNOSIS — R1031 Right lower quadrant pain: Secondary | ICD-10-CM | POA: Insufficient documentation

## 2023-11-23 DIAGNOSIS — R1032 Left lower quadrant pain: Secondary | ICD-10-CM | POA: Diagnosis present

## 2023-11-23 DIAGNOSIS — Z5321 Procedure and treatment not carried out due to patient leaving prior to being seen by health care provider: Secondary | ICD-10-CM | POA: Diagnosis not present

## 2023-11-23 LAB — URINALYSIS, ROUTINE W REFLEX MICROSCOPIC

## 2023-11-23 LAB — PREGNANCY, URINE: Preg Test, Ur: NEGATIVE

## 2023-11-23 LAB — URINALYSIS, MICROSCOPIC (REFLEX): RBC / HPF: NONE SEEN RBC/hpf (ref 0–5)

## 2023-11-23 NOTE — ED Notes (Addendum)
 No answer x 2. Charge RN aware. Patient isn't visualized in the lobby.

## 2023-11-23 NOTE — ED Triage Notes (Signed)
 Pt rocking back and forth in pain. Reports cyst left groin area possibly ruptured . Also reporting pain right abdomen that radiates to right back. States she thinks its her kidney Pain started today

## 2023-11-23 NOTE — ED Notes (Signed)
 Called in lobby. No answer. Outside and canteen area checked. Not visualized in the department.

## 2023-12-04 IMAGING — DX DG CHEST 1V PORT
1 series · 1 of 1 positions shown · non-contrast
Comparison: None.

CLINICAL DATA: Lupus flare x4 days.

EXAM:
PORTABLE CHEST 1 VIEW

[chest ap]
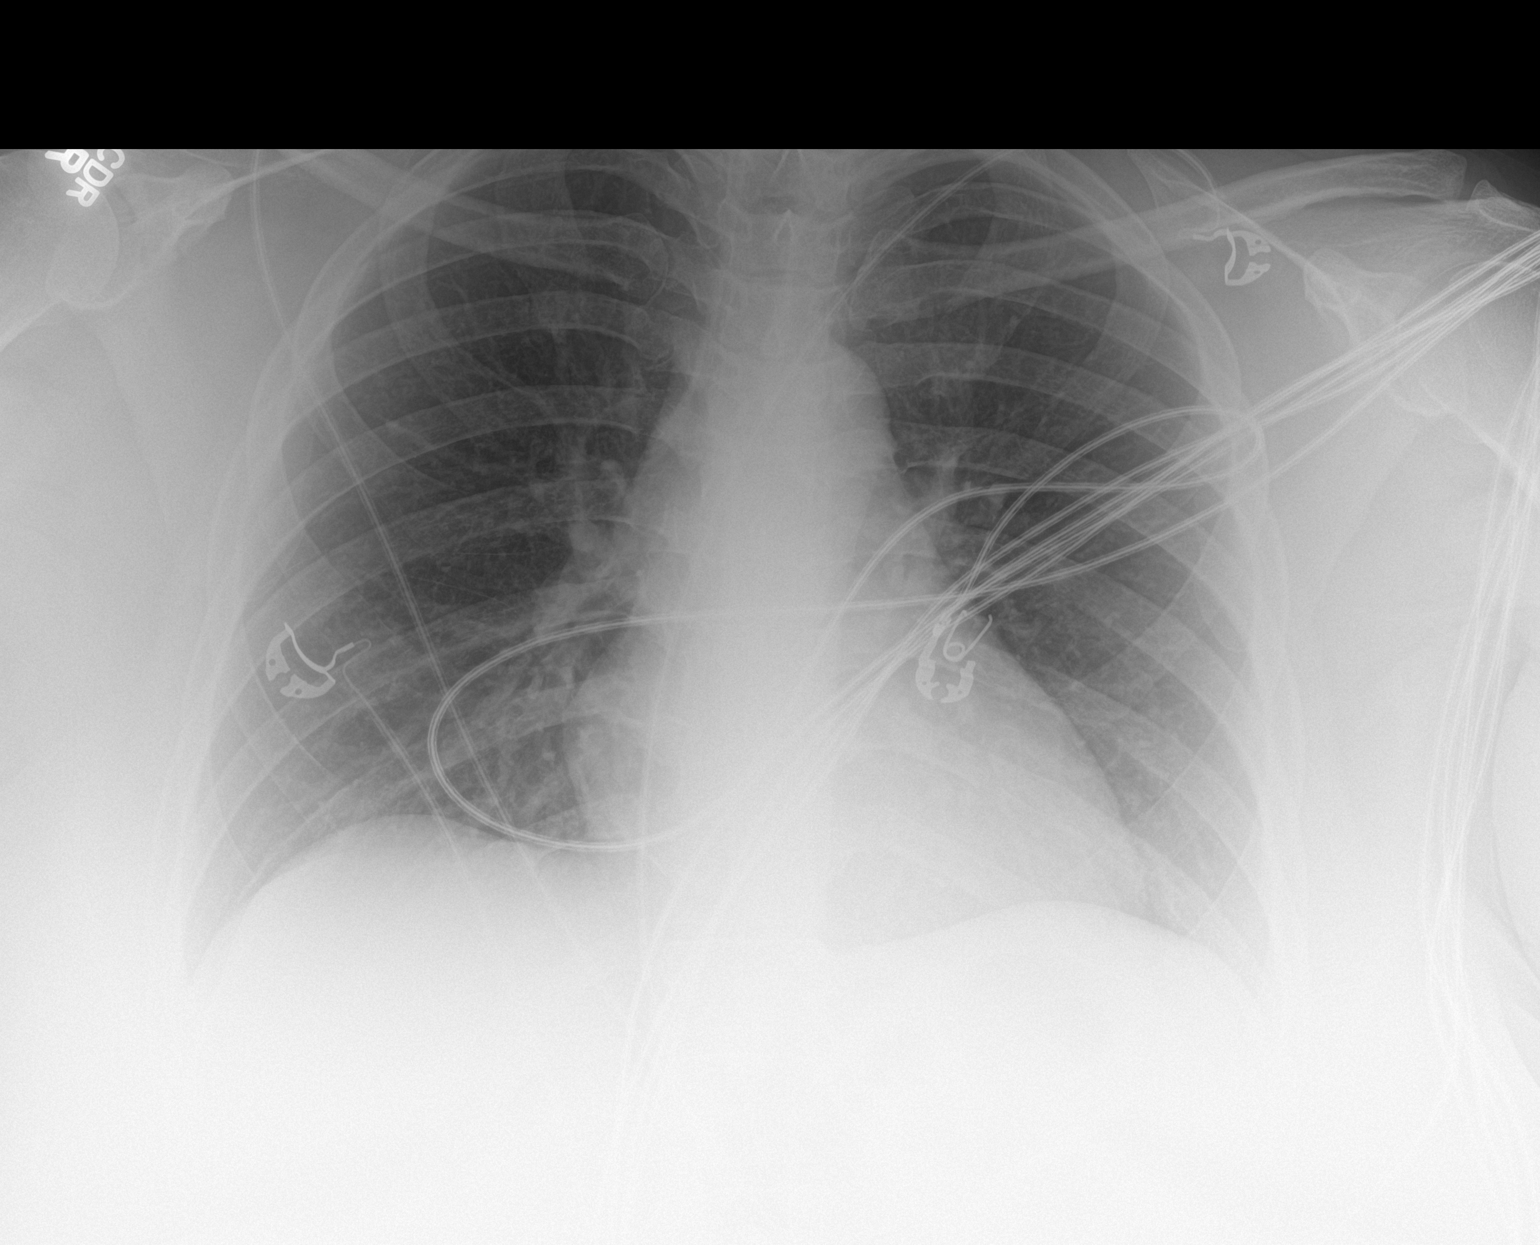

[1 of 1 positions shown; findings below may reference images not displayed]

FINDINGS: The heart size and mediastinal contours are within normal limits.
Both lungs are clear. The visualized skeletal structures are
unremarkable.
IMPRESSION: No active disease.

## 2023-12-27 ENCOUNTER — Emergency Department (HOSPITAL_BASED_OUTPATIENT_CLINIC_OR_DEPARTMENT_OTHER)

## 2023-12-27 ENCOUNTER — Other Ambulatory Visit: Payer: Self-pay

## 2023-12-27 ENCOUNTER — Encounter (HOSPITAL_BASED_OUTPATIENT_CLINIC_OR_DEPARTMENT_OTHER): Payer: Self-pay | Admitting: Emergency Medicine

## 2023-12-27 ENCOUNTER — Emergency Department (HOSPITAL_BASED_OUTPATIENT_CLINIC_OR_DEPARTMENT_OTHER): Admission: EM | Admit: 2023-12-27 | Discharge: 2023-12-27 | Disposition: A | Discharge status: Home / Self Care

## 2023-12-27 DIAGNOSIS — R1031 Right lower quadrant pain: Secondary | ICD-10-CM | POA: Insufficient documentation

## 2023-12-27 DIAGNOSIS — R1022 Pelvic and perineal pain left side: Secondary | ICD-10-CM | POA: Diagnosis not present

## 2023-12-27 DIAGNOSIS — R1032 Left lower quadrant pain: Secondary | ICD-10-CM | POA: Insufficient documentation

## 2023-12-27 DIAGNOSIS — R1030 Lower abdominal pain, unspecified: Secondary | ICD-10-CM

## 2023-12-27 DIAGNOSIS — R1021 Pelvic and perineal pain right side: Secondary | ICD-10-CM | POA: Insufficient documentation

## 2023-12-27 LAB — COMPREHENSIVE METABOLIC PANEL WITH GFR
ALT: 15 U/L (ref 0–44)
AST: 26 U/L (ref 15–41)
Albumin: 4.1 g/dL (ref 3.5–5.0)
Alkaline Phosphatase: 62 U/L (ref 38–126)
Anion gap: 12 (ref 5–15)
BUN: 5 mg/dL — ABNORMAL LOW (ref 6–20)
CO2: 26 mmol/L (ref 22–32)
Calcium: 9.4 mg/dL (ref 8.9–10.3)
Chloride: 104 mmol/L (ref 98–111)
Creatinine, Ser: 0.76 mg/dL (ref 0.44–1.00)
GFR, Estimated: >60 mL/min (ref >60)
Glucose, Bld: 104 mg/dL — ABNORMAL HIGH (ref 70–99)
Potassium: 3.7 mmol/L (ref 3.5–5.1)
Sodium: 141 mmol/L (ref 135–145)
Total Bilirubin: 0.4 mg/dL (ref 0.0–1.2)
Total Protein: 8.1 g/dL (ref 6.5–8.1)

## 2023-12-27 LAB — URINALYSIS, ROUTINE W REFLEX MICROSCOPIC

## 2023-12-27 LAB — CBC
HCT: 38.3 % (ref 36.0–46.0)
Hemoglobin: 13.9 g/dL (ref 12.0–15.0)
MCH: 29.9 pg (ref 26.0–34.0)
MCHC: 36.3 g/dL — ABNORMAL HIGH (ref 30.0–36.0)
MCV: 82.4 fL (ref 80.0–100.0)
Platelets: 336 K/uL (ref 150–400)
RBC: 4.65 MIL/uL (ref 3.87–5.11)
RDW: 14 % (ref 11.5–15.5)
WBC: 9.9 K/uL (ref 4.0–10.5)
nRBC: 0 % (ref 0.0–0.2)

## 2023-12-27 LAB — URINALYSIS, MICROSCOPIC (REFLEX)
RBC / HPF: NONE SEEN RBC/hpf (ref 0–5)
WBC, UA: NONE SEEN WBC/hpf (ref 0–5)

## 2023-12-27 LAB — LIPASE, BLOOD: Lipase: 27 U/L (ref 11–51)

## 2023-12-27 MED ORDER — ACETAMINOPHEN 500 MG PO TABS
500.0000 mg | ORAL_TABLET | Freq: Once | ORAL | Status: AC
  Administered 2023-12-27: 500 mg via ORAL
  Filled 2023-12-27: qty 1

## 2023-12-27 MED ORDER — OXYCODONE HCL 5 MG PO TABS
5.0000 mg | ORAL_TABLET | Freq: Once | ORAL | Status: AC
Start: 2023-12-27 — End: 2023-12-27
  Administered 2023-12-27: 5 mg via ORAL
  Filled 2023-12-27: qty 1

## 2023-12-27 MED ORDER — OXYCODONE-ACETAMINOPHEN 5-325 MG PO TABS
1.0000 | ORAL_TABLET | Freq: Once | ORAL | Status: AC
  Administered 2023-12-27: 1 via ORAL
  Filled 2023-12-27: qty 1

## 2023-12-27 MED ORDER — ONDANSETRON 4 MG PO TBDP
4.0000 mg | ORAL_TABLET | Freq: Once | ORAL | Status: AC
  Administered 2023-12-27: 4 mg via ORAL
  Filled 2023-12-27: qty 1

## 2023-12-27 MED ORDER — FENTANYL CITRATE (PF) 50 MCG/ML IJ SOSY
50.0000 ug | PREFILLED_SYRINGE | Freq: Once | INTRAMUSCULAR | Status: AC
  Administered 2023-12-27: 50 ug via INTRAVENOUS
  Filled 2023-12-27: qty 1

## 2023-12-27 NOTE — ED Provider Notes (Signed)
 [place] [facility] Provider Note   CSN: [id number] Arrival date & time: 12/27/23  1050     Patient presents with: Abdominal Pain   Amy Mathis is a 43 y.o. female with history of hematuria, lupus, IBS, chronic abdominal pain, presents with concern for lower pelvic pain that has been ongoing for the past couple days.  She reports that she is having some right sided pelvic pain that she states feels consistent with lupus flares.  She also reports left-sided pelvic pain that started earlier this morning.  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  She is concern for a possible ruptured cyst.  She denies any increased urinary frequency or dysuria.  She has a history of chronic hematuria.  Denies any nausea, vomiting, fever, or chills.  She reports her home medications of Tylenol  and oxycodone  do not seem to be helping with the pain.  Patient has past surgical history of oophorectomy and hysterectomy.  On review of ER note from 10/22/2023, this note states Patient seen by multiple ED providers (x5) for similar complaints, including recent admission on 7/15 for right-sided abdominal and back pain. Extensive workup included multiple imaging studies which showed no acute process in the abdomen that was significant for steatohepatitis and a 3.5 cm left adnexal cyst.    Abdominal Pain      Prior to Admission medications   Medication Sig Start Date End Date Taking? Authorizing Provider  ciprofloxacin  (CIPRO ) 500 MG tablet Take 0.5 tablets (250 mg total) by mouth every 12 (twelve) hours. 06/15/23   Keith, Kayla N, PA-C  gabapentin  (NEURONTIN ) 100 MG capsule Take 1 capsule (100 mg total) by mouth 3 (three) times daily for 10 days. 12/06/22 12/16/22  Harris, Abigail, PA-C  hydroxychloroquine (PLAQUENIL) 200 MG tablet Take by mouth 2 (two) times daily.    [provider]  lidocaine   (LIDODERM ) 5 % Place 1 patch onto the skin daily. Remove & Discard patch within 12 hours or as directed by MD 09/11/23   Theotis Cameron HERO, PA-C  methocarbamol  (ROBAXIN ) 500 MG tablet Take 1 tablet (500 mg total) by mouth 2 (two) times daily. 09/06/23   Bauer, Collin S, PA-C  ondansetron  (ZOFRAN ) 4 MG tablet Take 1 tablet (4 mg total) by mouth every 6 (six) hours. 09/06/23   Bauer, Collin S, PA-C  oxyCODONE  (ROXICODONE ) 5 MG immediate release tablet Take 1 tablet (5 mg total) by mouth every 8 (eight) hours as needed for up to 10 doses for severe pain. 10/03/22   Smoot, Sarah A, PA-C  pantoprazole  (PROTONIX ) 20 MG tablet Take 1 tablet (20 mg total) by mouth daily. 09/11/23   Theotis Cameron HERO, PA-C  promethazine  (PHENERGAN ) 25 MG tablet Take 1 tablet (25 mg total) by mouth every 6 (six) hours as needed for nausea or vomiting. 09/14/22   Desiderio Chew, PA-C  valACYclovir  (VALTREX ) 1000 MG tablet Take 1 tablet (1,000 mg total) by mouth 3 (three) times daily. 12/06/22   Harris, Abigail, PA-C    Allergies: Morphine, Morphine and codeine, Penicillin g, Penicillins, Tramadol, Meperidine hcl, and Nsaids    Review of Systems  Gastrointestinal:  Positive for abdominal pain.    Updated Vital Signs BP (!) 137/94   Pulse 77   Temp 98.7 F (37.1 C)   Resp 18   Ht 5' 10 (1.778 m)   Wt (!) 155.1 kg   SpO2 98%  BMI 49.07 kg/m   Physical Exam Vitals and nursing note reviewed.  Constitutional:      General: She is not in acute distress.    Appearance: She is well-developed.     Comments: No active vomiting  HENT:     Head: Normocephalic and atraumatic.  Eyes:     Conjunctiva/sclera: Conjunctivae normal.  Cardiovascular:     Rate and Rhythm: Normal rate and regular rhythm.     Heart sounds: No murmur heard. Pulmonary:     Effort: Pulmonary effort is normal. No respiratory distress.     Breath sounds: Normal breath sounds.  Abdominal:     Palpations: Abdomen is soft.     Tenderness: There is  abdominal tenderness.     Comments: Mild tenderness just left to the suprapubic region on the right and left.  No rebound or guarding.  No CVA tenderness to palpation bilaterally  Musculoskeletal:        General: No swelling.     Cervical back: Neck supple.  Skin:    General: Skin is warm and dry.     Capillary Refill: Capillary refill takes less than 2 seconds.  Neurological:     Mental Status: She is alert.  Psychiatric:        Mood and Affect: Mood normal.     (all labs ordered are listed, but only abnormal results are displayed) Labs Reviewed  COMPREHENSIVE METABOLIC PANEL WITH GFR - Abnormal; Notable for the following components:      Result Value   Glucose, Bld 104 (*)    BUN 5 (*)    All other components within normal limits  CBC - Abnormal; Notable for the following components:   MCHC 36.3 (*)    All other components within normal limits  URINALYSIS, ROUTINE W REFLEX MICROSCOPIC - Abnormal; Notable for the following components:   APPearance TURBID (*)    Glucose, UA   (*)    Value: TEST NOT REPORTED DUE TO COLOR INTERFERENCE OF URINE PIGMENT   Hgb urine dipstick   (*)    Value: TEST NOT REPORTED DUE TO COLOR INTERFERENCE OF URINE PIGMENT   Bilirubin Urine   (*)    Value: TEST NOT REPORTED DUE TO COLOR INTERFERENCE OF URINE PIGMENT   Ketones, ur   (*)    Value: TEST NOT REPORTED DUE TO COLOR INTERFERENCE OF URINE PIGMENT   Protein, ur   (*)    Value: TEST NOT REPORTED DUE TO COLOR INTERFERENCE OF URINE PIGMENT   Nitrite   (*)    Value: TEST NOT REPORTED DUE TO COLOR INTERFERENCE OF URINE PIGMENT   Leukocytes,Ua   (*)    Value: TEST NOT REPORTED DUE TO COLOR INTERFERENCE OF URINE PIGMENT   All other components within normal limits  URINALYSIS, MICROSCOPIC (REFLEX) - Abnormal; Notable for the following components:   Bacteria, UA MANY (*)    All other components within normal limits  LIPASE, BLOOD    EKG: None  Radiology: US  PELVIS (TRANSABDOMINAL  ONLY) Result Date: 12/27/2023 CLINICAL DATA:  Status post complete hysterectomy and oophorectomy presenting with pelvic pain. EXAM: TRANSABDOMINAL AND TRANSVAGINAL ULTRASOUND OF PELVIS TECHNIQUE: Transabdominal ultrasound examination of the pelvis was performed. Transabdominal technique was performed for global imaging of the pelvis including uterus, ovaries, adnexal regions, and pelvic cul-de-sac. It should be noted that the transvaginal portion of the study was declined by the patient. COMPARISON:  None Available. FINDINGS: Uterus The uterus is surgically absent. Endometrium Thickness: N/A. Right  ovary The right ovary is surgically absent. Left ovary The left ovary is surgically absent. A 3.3 cm x 3.3 cm x 3.3 cm round, hypoechoic area is seen within the left adnexa. No abnormal flow is noted within this region on color Doppler evaluation. Other findings No abnormal free fluid. IMPRESSION: 1. Findings consistent with history of complete hysterectomy and oophorectomy. 2. Left adnexal cyst, as described above. Correlation with nonemergent MRI is recommended for improved characterization of this lesion. Electronically Signed   By: Suzen Dials M.D.   On: 12/27/2023 13:21     Procedures   Medications Ordered in the ED  fentaNYL  (SUBLIMAZE ) injection 50 mcg (50 mcg Intravenous Given 12/27/23 1127)  oxyCODONE -acetaminophen  (PERCOCET/ROXICET) 5-325 MG per tablet 1 tablet (1 tablet Oral Given 12/27/23 1126)  acetaminophen  (TYLENOL ) tablet 500 mg (500 mg Oral Given 12/27/23 1126)  ondansetron  (ZOFRAN -ODT) disintegrating tablet 4 mg (4 mg Oral Given 12/27/23 1336)  oxyCODONE  (Oxy IR/ROXICODONE ) immediate release tablet 5 mg (5 mg Oral Given 12/27/23 1336)                                    Medical Decision Making Amount and/or Complexity of Data Reviewed Labs: ordered. Radiology: ordered.  Risk OTC drugs. Prescription drug management.     Differential diagnosis includes but is not limited  to  ruptured ovarian cyst, acute cholecystitis, cholelithiasis, cholangitis, choledocholithiasis, peptic ulcer, gastritis, gastroenteritis, appendicitis, IBS, IBD, DKA, nephrolithiasis, UTI, pyelonephritis, pancreatitis, diverticulitis, mesenteric ischemia, abdominal aortic aneurysm, small bowel obstruction, volvulus   ED Course:  Upon initial evaluation, patient appears uncomfortable, but no acute distress.  Stable vitals aside from her elevated blood pressure 155/92 upon arrival.  She has tenderness just left and right of the suprapubic region.  No rebound or guarding.  Given that she was noted to have a 3.5 cm left adnexal cyst, and this corresponds with acute location of pain, will obtain ultrasound of this area to evaluate for possible cyst rupture.  Will obtain basic labs.  Labs Ordered: I Ordered, and personally interpreted labs.  The pertinent results include:   CBC within normal limits CMP within normal limits Lipase within normal limits Urinalysis with gross hematuria, appears to be an unclean catch. Budding yeast present  Imaging Studies ordered: I ordered imaging studies including pelvic ultrasound I independently visualized the imaging with scope of interpretation limited to determining acute life threatening conditions related to emergency care. Imaging showed  Left ovary   The left ovary is surgically absent. A 3.3 cm x 3.3 cm x 3.3 cm round, hypoechoic area is seen within the left adnexa. No abnormal flow is noted within this region on color Doppler evaluation.   Other findings   No abnormal free fluid.   IMPRESSION: 1. Findings consistent with history of complete hysterectomy and oophorectomy. 2. Left adnexal cyst, as described above. Correlation with nonemergent MRI is recommended for improved characterization of this lesion.  I agree with the radiologist interpretation   Medications Given: 10mg  oxycodone  1000 mg Tylenol  Zofran   Upon re-evaluation,  patient patient remains well-appearing.  We discussed that her ultrasound showed a stable 3.3 cm cyst in the left adnexa.  This is consistent with previous imaging.  No change in size.  No signs of rupture.  This does not seem like it should be causing her pain today.  Her workup is otherwise reassuring.  CMP, lipase, and CBC within normal limits.  Urinalysis  has some bacteria on it, but also appears to be an unclean catch with squamous epithelial cells present.  Yeast also were present.  I discussed this with the patient, but she states she is not having any urinary symptoms such as dysuria, increased frequency.  We will hold off on treatment for UTI.  She does not have any flank pain, fevers, leukocytosis to suggest pyelonephritis. Patient has well-documented history of abdominal pain and frequent emergency room visits.  Given her reassuring labs, vital signs, and imaging today, I do not feel any additional imaging or workup is needed at this time.  Feel her pain is consistent with her chronic pain flares.  She is stable and appropriate for discharge home.    Impression: Lower abdominal pain  Disposition:  The patient was discharged home with instructions to use home oxycodone  and Tylenol  as needed for pain.  Follow-up with her PCP for recheck of symptoms within the next week. Return precautions given.    Record Review: External records from outside source obtained and reviewed including ER visit from 10/22/2023 reviewed from [facility] for pelvic pain. Patient with extensive workup from admission on 09/30/2023 for her pelvic pain.  At that time, a CT scan showing a 3.5 cm cystic area in the left adnexa  was obtained. Multiple imaging studies, including CT scans, showed no acute localizing process but did reveal hepatomegaly with fatty infiltration, suggesting steatohepatitis. Gastroenterology was consulted an EGD was performed on 10/06/2023 showed normal-appearing esophagus and stomach as well  as duodenal bulb, patient had 1 episode of hematochezia with no recurrence GI recommended outpatient follow-up.      This chart was dictated using voice recognition software, Dragon. Despite the best efforts of this provider to proofread and correct errors, errors may still occur which can change documentation meaning.       Final diagnoses:  Lower abdominal pain    ED Discharge Orders     None          Veta Palma, PA-C 12/27/23 1337    Neysa Caron PARAS, DO 12/27/23 1515

## 2023-12-27 NOTE — Discharge Instructions (Signed)
 Your ultrasound showed a 3.3 cm cyst in the left side of your pelvis.  This does not have appeared to have changed in size, no signs of rupture.  I have included your ultrasound results below for your reference.  The remainder of your workup is reassuring.  Your kidney, liver, pancreas labs are normal.  Electrolytes are normal.  As discussed, your urine did have yeast in it. Given you are not having any urinary symptoms, we will hold off on treating for UTI.  Please continue taking your home medications as prescribed for pain.  Please follow-up with your PCP within the next week for recheck of your symptoms.  Please return to the ER if you develop worsening abdominal pain, persistent vomiting, fevers, any other new or concerning symptoms   EXAM:  TRANSABDOMINAL AND TRANSVAGINAL ULTRASOUND OF PELVIS    TECHNIQUE:  Transabdominal ultrasound examination of the pelvis was performed.  Transabdominal technique was performed for global imaging of the  pelvis including uterus, ovaries, adnexal regions, and pelvic  cul-de-sac.    It should be noted that the transvaginal portion of the study was  declined by the patient.    COMPARISON:  None Available.    FINDINGS:  Uterus    The uterus is surgically absent.    Endometrium    Thickness: N/A.    Right ovary    The right ovary is surgically absent.    Left ovary    The left ovary is surgically absent. A 3.3 cm x 3.3 cm x 3.3 cm  round, hypoechoic area is seen within the left adnexa. No abnormal  flow is noted within this region on color Doppler evaluation.    Other findings    No abnormal free fluid.    IMPRESSION:  1. Findings consistent with history of complete hysterectomy and  oophorectomy.  2. Left adnexal cyst, as described above. Correlation with  nonemergent MRI is recommended for improved characterization of this  lesion.      Electronically Signed    By: Suzen Dials M.D.    On: 12/27/2023 13:21

## 2023-12-27 NOTE — ED Notes (Signed)
 Pt unable to provide a urine sample at this time will try again later.

## 2023-12-27 NOTE — ED Notes (Signed)
 Pt alert and oriented X 4 at the time of discharge. RR even and unlabored. No acute distress noted. Pt verbalized understanding of discharge instructions as discussed. Pt ambulatory to lobby at time of discharge.

## 2023-12-27 NOTE — ED Triage Notes (Signed)
 Right and left side pain x 2 days. Hx of similar pain. 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

## 2023-12-31 ENCOUNTER — Other Ambulatory Visit: Payer: Self-pay

## 2023-12-31 ENCOUNTER — Encounter (HOSPITAL_BASED_OUTPATIENT_CLINIC_OR_DEPARTMENT_OTHER): Payer: Self-pay | Admitting: Emergency Medicine

## 2023-12-31 ENCOUNTER — Emergency Department (HOSPITAL_BASED_OUTPATIENT_CLINIC_OR_DEPARTMENT_OTHER)
Admission: EM | Admit: 2023-12-31 | Discharge: 2023-12-31 | Disposition: A | Discharge status: Home / Self Care | Attending: Emergency Medicine | Admitting: Emergency Medicine

## 2023-12-31 ENCOUNTER — Emergency Department (HOSPITAL_BASED_OUTPATIENT_CLINIC_OR_DEPARTMENT_OTHER)

## 2023-12-31 DIAGNOSIS — D72829 Elevated white blood cell count, unspecified: Secondary | ICD-10-CM | POA: Insufficient documentation

## 2023-12-31 DIAGNOSIS — R1032 Left lower quadrant pain: Secondary | ICD-10-CM | POA: Diagnosis present

## 2023-12-31 DIAGNOSIS — R197 Diarrhea, unspecified: Secondary | ICD-10-CM | POA: Diagnosis not present

## 2023-12-31 DIAGNOSIS — R110 Nausea: Secondary | ICD-10-CM | POA: Insufficient documentation

## 2023-12-31 LAB — COMPREHENSIVE METABOLIC PANEL WITH GFR
ALT: 26 U/L (ref 0–44)
AST: 29 U/L (ref 15–41)
Albumin: 4.1 g/dL (ref 3.5–5.0)
Alkaline Phosphatase: 63 U/L (ref 38–126)
Anion gap: 11 (ref 5–15)
BUN: 6 mg/dL (ref 6–20)
CO2: 26 mmol/L (ref 22–32)
Calcium: 9.5 mg/dL (ref 8.9–10.3)
Chloride: 103 mmol/L (ref 98–111)
Creatinine, Ser: 0.74 mg/dL (ref 0.44–1.00)
GFR, Estimated: >60 mL/min (ref >60)
Glucose, Bld: 93 mg/dL (ref 70–99)
Potassium: 3.9 mmol/L (ref 3.5–5.1)
Sodium: 140 mmol/L (ref 135–145)
Total Bilirubin: 0.4 mg/dL (ref 0.0–1.2)
Total Protein: 7.8 g/dL (ref 6.5–8.1)

## 2023-12-31 LAB — CBC
HCT: 38.7 % (ref 36.0–46.0)
Hemoglobin: 13.9 g/dL (ref 12.0–15.0)
MCH: 29.6 pg (ref 26.0–34.0)
MCHC: 35.9 g/dL (ref 30.0–36.0)
MCV: 82.5 fL (ref 80.0–100.0)
Platelets: 378 K/uL (ref 150–400)
RBC: 4.69 MIL/uL (ref 3.87–5.11)
RDW: 14.3 % (ref 11.5–15.5)
WBC: 10.7 K/uL — ABNORMAL HIGH (ref 4.0–10.5)
nRBC: 0 % (ref 0.0–0.2)

## 2023-12-31 LAB — URINALYSIS, ROUTINE W REFLEX MICROSCOPIC

## 2023-12-31 LAB — URINALYSIS, MICROSCOPIC (REFLEX): WBC, UA: NONE SEEN WBC/hpf (ref 0–5)

## 2023-12-31 LAB — LIPASE, BLOOD: Lipase: 18 U/L (ref 11–51)

## 2023-12-31 MED ORDER — HYDROMORPHONE HCL 1 MG/ML IJ SOLN
1.0000 mg | Freq: Once | INTRAMUSCULAR | Status: AC
  Administered 2023-12-31: 1 mg via INTRAVENOUS
  Filled 2023-12-31: qty 1

## 2023-12-31 MED ORDER — ONDANSETRON HCL 4 MG/2ML IJ SOLN
4.0000 mg | Freq: Once | INTRAMUSCULAR | Status: AC
  Administered 2023-12-31: 4 mg via INTRAVENOUS
  Filled 2023-12-31: qty 2

## 2023-12-31 MED ORDER — IOHEXOL 300 MG/ML  SOLN
125.0000 mL | Freq: Once | INTRAMUSCULAR | Status: AC | PRN
  Administered 2023-12-31: 125 mL via INTRAVENOUS

## 2023-12-31 NOTE — ED Notes (Signed)
 Patient transported to radiology

## 2023-12-31 NOTE — Discharge Instructions (Signed)
 Please continue to take your home pain and nausea medication, follow-up with your surgeon on Friday as planned.  Return if you have significant worsening abdominal pain despite treatment.

## 2023-12-31 NOTE — ED Provider Notes (Signed)
 [place] [facility] Provider Note   CSN: [id number] Arrival date & time: 12/31/23  1611     Patient presents with: Abdominal Pain (RUQ)   Amy Mathis is a 43 y.o. female with past medical history seen for IBS, lupus, status post hysterectomy, self cholecystectomy, oophorectomy who presents with concern for acute on chronic abdominal pain.  Patient reports that she has had some severe abdominal pain intermittently, she gets abdominal pain as a part of her lupus.  She takes oxycodone  at home but reports it is not helping.  She was seen 4 days ago for same, diagnosed with UTI, and did not have any other acute findings at that time.  Has been told that she has complications related to her surgery.  She has a follow-up planned with a surgeon for possible exploratory surgery and further evaluation.  She reports that the pain is as severe as the last time she had a torsion.  She rates the pain 9/10.    Abdominal Pain      Prior to Admission medications   Medication Sig Start Date End Date Taking? Authorizing Provider  ciprofloxacin  (CIPRO ) 500 MG tablet Take 0.5 tablets (250 mg total) by mouth every 12 (twelve) hours. 06/15/23   Keith, Kayla N, PA-C  gabapentin  (NEURONTIN ) 100 MG capsule Take 1 capsule (100 mg total) by mouth 3 (three) times daily for 10 days. 12/06/22 12/16/22  Harris, Abigail, PA-C  hydroxychloroquine (PLAQUENIL) 200 MG tablet Take by mouth 2 (two) times daily.    [provider]  lidocaine  (LIDODERM ) 5 % Place 1 patch onto the skin daily. Remove & Discard patch within 12 hours or as directed by MD 09/11/23   Theotis Cameron HERO, PA-C  methocarbamol  (ROBAXIN ) 500 MG tablet Take 1 tablet (500 mg total) by mouth 2 (two) times daily. 09/06/23   Bauer, Collin S, PA-C  ondansetron  (ZOFRAN ) 4 MG tablet Take 1 tablet (4 mg total) by mouth every 6 (six) hours. 09/06/23   Bauer, Collin S, PA-C  oxyCODONE  (ROXICODONE ) 5 MG immediate release  tablet Take 1 tablet (5 mg total) by mouth every 8 (eight) hours as needed for up to 10 doses for severe pain. 10/03/22   Smoot, Lauraine LABOR, PA-C  pantoprazole  (PROTONIX ) 20 MG tablet Take 1 tablet (20 mg total) by mouth daily. 09/11/23   Theotis Cameron M, PA-C  promethazine  (PHENERGAN ) 25 MG tablet Take 1 tablet (25 mg total) by mouth every 6 (six) hours as needed for nausea or vomiting. 09/14/22   Desiderio Chew, PA-C  valACYclovir  (VALTREX ) 1000 MG tablet Take 1 tablet (1,000 mg total) by mouth 3 (three) times daily. 12/06/22   Harris, Abigail, PA-C    Allergies: Morphine, Morphine and codeine, Penicillin g, Penicillins, Tramadol, Meperidine hcl, and Nsaids    Review of Systems  Gastrointestinal:  Positive for abdominal pain.  All other systems reviewed and are negative.   Updated Vital Signs BP 116/68 (BP Location: Right Arm)   Pulse 77   Temp 98.6 F (37 C) (Oral)   Resp 18   SpO2 97%   Physical Exam Vitals and nursing note reviewed.  Constitutional:      General: She is not in acute distress.    Appearance: Normal appearance. She is obese.  HENT:     Head: Normocephalic and atraumatic.  Eyes:     General:        Right eye: No discharge.        Left eye:  No discharge.  Cardiovascular:     Rate and Rhythm: Normal rate and regular rhythm.     Heart sounds: No murmur heard.    No friction rub. No gallop.  Pulmonary:     Effort: Pulmonary effort is normal.     Breath sounds: Normal breath sounds.  Abdominal:     General: Bowel sounds are normal.     Palpations: Abdomen is soft.     Comments: Focal tenderness to palpation in the left lower quadrant with some guarding, no rebound, rigidity.  Skin:    General: Skin is warm and dry.     Capillary Refill: Capillary refill takes less than 2 seconds.  Neurological:     Mental Status: She is alert and oriented to person, place, and time.  Psychiatric:        Mood and Affect: Mood normal.        Behavior: Behavior normal.      (all labs ordered are listed, but only abnormal results are displayed) Labs Reviewed  CBC - Abnormal; Notable for the following components:      Result Value   WBC 10.7 (*)    All other components within normal limits  URINALYSIS, ROUTINE W REFLEX MICROSCOPIC - Abnormal; Notable for the following components:   Color, Urine BROWN (*)    APPearance TURBID (*)    Glucose, UA   (*)    Value: TEST NOT REPORTED DUE TO COLOR INTERFERENCE OF URINE PIGMENT   Hgb urine dipstick   (*)    Value: TEST NOT REPORTED DUE TO COLOR INTERFERENCE OF URINE PIGMENT   Bilirubin Urine   (*)    Value: TEST NOT REPORTED DUE TO COLOR INTERFERENCE OF URINE PIGMENT   Ketones, ur   (*)    Value: TEST NOT REPORTED DUE TO COLOR INTERFERENCE OF URINE PIGMENT   Protein, ur   (*)    Value: TEST NOT REPORTED DUE TO COLOR INTERFERENCE OF URINE PIGMENT   Nitrite   (*)    Value: TEST NOT REPORTED DUE TO COLOR INTERFERENCE OF URINE PIGMENT   Leukocytes,Ua   (*)    Value: TEST NOT REPORTED DUE TO COLOR INTERFERENCE OF URINE PIGMENT   All other components within normal limits  URINALYSIS, MICROSCOPIC (REFLEX) - Abnormal; Notable for the following components:   Bacteria, UA RARE (*)    All other components within normal limits  COMPREHENSIVE METABOLIC PANEL WITH GFR  LIPASE, BLOOD    EKG: None  Radiology: CT ABDOMEN PELVIS W CONTRAST Result Date: 12/31/2023 CLINICAL DATA:  Abdominal pain, acute, nonlocalized EXAM: CT ABDOMEN AND PELVIS WITH CONTRAST TECHNIQUE: Multidetector CT imaging of the abdomen and pelvis was performed using the standard protocol following bolus administration of intravenous contrast. RADIATION DOSE REDUCTION: This exam was performed according to the departmental dose-optimization program which includes automated exposure control, adjustment of the mA and/or kV according to patient size and/or use of iterative reconstruction technique. CONTRAST:  OMNIPAQUE  IOHEXOL  300 MG/ML  SOLN  COMPARISON:  10/08/2023 FINDINGS: Lower chest: No acute findings Hepatobiliary: Prior cholecystectomy. Diffuse fatty infiltration of the liver. No focal hepatic abnormality. Pancreas: No focal abnormality or ductal dilatation. Spleen: No focal abnormality.  Normal size. Adrenals/Urinary Tract: No adrenal abnormality. No focal renal abnormality. No stones or hydronephrosis. Urinary bladder is unremarkable. Stomach/Bowel: Stomach, large and small bowel grossly unremarkable. Appendix not visualized. No pericecal inflammation. Vascular/Lymphatic: No evidence of aneurysm or adenopathy. Reproductive: Prior hysterectomy.  No adnexal masses. Other: No free fluid  or free air. Musculoskeletal: No acute bony abnormality. IMPRESSION: No acute findings in the abdomen or pelvis. Hepatic steatosis. Electronically Signed   By: Franky Crease M.D.   On: 12/31/2023 21:07     Procedures   Medications Ordered in the ED  HYDROmorphone  (DILAUDID ) injection 1 mg (has no administration in time range)  HYDROmorphone  (DILAUDID ) injection 1 mg (1 mg Intravenous Given 12/31/23 1951)  ondansetron  (ZOFRAN ) injection 4 mg (4 mg Intravenous Given 12/31/23 1950)  iohexol  (OMNIPAQUE ) 300 MG/ML solution 125 mL (125 mLs Intravenous Contrast Given 12/31/23 2045)                                    Medical Decision Making Amount and/or Complexity of Data Reviewed Labs: ordered. Radiology: ordered.  Risk Prescription drug management.   This patient is a 43 y.o. female  who presents to the ED for concern of abdominal pain, nausea, diarrhea.   Differential diagnoses prior to evaluation: The emergent differential diagnosis includes, but is not limited to,  The causes of generalized abdominal pain include but are not limited to AAA, mesenteric ischemia, appendicitis, diverticulitis, DKA, gastritis, gastroenteritis, AMI, nephrolithiasis, pancreatitis, peritonitis, adrenal insufficiency,lead poisoning, iron toxicity, intestinal  ischemia, constipation, UTI,SBO/LBO, splenic rupture, biliary disease, IBD, IBS, PUD, or hepatitis. . This is not an exhaustive differential.   Past Medical History / Co-morbidities / Social History:  IBS, lupus, status post hysterectomy, self cholecystectomy, oophorectomy  Additional history: Chart reviewed. Pertinent results include: Extensively reviewed lab work, imaging from previous emergency room visits  Physical Exam: Physical exam performed. The pertinent findings include: Vital signs overall stable, focal left lower quadrant tenderness with some guarding, no rebound, rigidity.  Lab Tests/Imaging studies: I personally interpreted labs/imaging and the pertinent results include: CBC notable for mildly cytosis of 10.7, CMP unremarkable, lipase unremarkable, UA difficult to assess due to patient taking AZO, she was treated for urinary tract infection, still taking antibiotics, I think if this is still appropriate.  CT abdomen pelvis with no new intra-abdominal finding to explain patient's symptoms.. I agree with the radiologist interpretation.   Medications: I ordered medication including Dilaudid  for pain, Zofran  for nausea, on reassessment some mild improvement, encouraged her to follow-up as scheduled with her surgeon on Friday, no other acute management indicated, continue her home pain medications..  I have reviewed the patients home medicines and have made adjustments as needed.   Disposition: After consideration of the diagnostic results and the patients response to treatment, I feel that patient is stable for discharge with plan as above .   emergency department workup does not suggest an emergent condition requiring admission or immediate intervention beyond what has been performed at this time. The plan is: as above. The patient is safe for discharge and has been instructed to return immediately for worsening symptoms, change in symptoms or any other concerns.   Final diagnoses:   Left lower quadrant abdominal pain    ED Discharge Orders     None          Rosan Sherlean DEL, PA-C 12/31/23 2119    Pamella Ozell LABOR, DO 01/02/24 0028

## 2023-12-31 NOTE — ED Triage Notes (Signed)
 Right abd pain , reports lupus flare up . Denies NV , was here 2 days ago for same

## 2024-01-16 ENCOUNTER — Emergency Department (HOSPITAL_BASED_OUTPATIENT_CLINIC_OR_DEPARTMENT_OTHER)
Admission: EM | Admit: 2024-01-16 | Discharge: 2024-01-16 | Disposition: A | Discharge status: Home / Self Care | Attending: Emergency Medicine | Admitting: Emergency Medicine

## 2024-01-16 ENCOUNTER — Encounter (HOSPITAL_BASED_OUTPATIENT_CLINIC_OR_DEPARTMENT_OTHER): Payer: Self-pay

## 2024-01-16 ENCOUNTER — Other Ambulatory Visit: Payer: Self-pay

## 2024-01-16 DIAGNOSIS — R300 Dysuria: Secondary | ICD-10-CM | POA: Diagnosis present

## 2024-01-16 DIAGNOSIS — N3001 Acute cystitis with hematuria: Secondary | ICD-10-CM | POA: Insufficient documentation

## 2024-01-16 DIAGNOSIS — R1032 Left lower quadrant pain: Secondary | ICD-10-CM

## 2024-01-16 LAB — URINALYSIS, ROUTINE W REFLEX MICROSCOPIC

## 2024-01-16 LAB — COMPREHENSIVE METABOLIC PANEL WITH GFR
ALT: 21 U/L (ref 0–44)
AST: 27 U/L (ref 15–41)
Albumin: 4.1 g/dL (ref 3.5–5.0)
Alkaline Phosphatase: 59 U/L (ref 38–126)
Anion gap: 11 (ref 5–15)
BUN: 11 mg/dL (ref 6–20)
CO2: 25 mmol/L (ref 22–32)
Calcium: 9.9 mg/dL (ref 8.9–10.3)
Chloride: 103 mmol/L (ref 98–111)
Creatinine, Ser: 0.74 mg/dL (ref 0.44–1.00)
GFR, Estimated: >60 mL/min (ref >60)
Glucose, Bld: 99 mg/dL (ref 70–99)
Potassium: 4.3 mmol/L (ref 3.5–5.1)
Sodium: 139 mmol/L (ref 135–145)
Total Bilirubin: 0.3 mg/dL (ref 0.0–1.2)
Total Protein: 8.2 g/dL — ABNORMAL HIGH (ref 6.5–8.1)

## 2024-01-16 LAB — CBC
HCT: 40.8 % (ref 36.0–46.0)
Hemoglobin: 14.5 g/dL (ref 12.0–15.0)
MCH: 29 pg (ref 26.0–34.0)
MCHC: 35.5 g/dL (ref 30.0–36.0)
MCV: 81.6 fL (ref 80.0–100.0)
Platelets: 322 K/uL (ref 150–400)
RBC: 5 MIL/uL (ref 3.87–5.11)
RDW: 14.3 % (ref 11.5–15.5)
WBC: 10.3 K/uL (ref 4.0–10.5)
nRBC: 0 % (ref 0.0–0.2)

## 2024-01-16 LAB — URINALYSIS, MICROSCOPIC (REFLEX)

## 2024-01-16 LAB — LIPASE, BLOOD: Lipase: 35 U/L (ref 11–51)

## 2024-01-16 MED ORDER — FENTANYL CITRATE (PF) 50 MCG/ML IJ SOSY
75.0000 ug | PREFILLED_SYRINGE | Freq: Once | INTRAMUSCULAR | Status: AC
  Administered 2024-01-16: 75 ug via INTRAVENOUS
  Filled 2024-01-16: qty 2

## 2024-01-16 MED ORDER — OXYCODONE-ACETAMINOPHEN 5-325 MG PO TABS
2.0000 | ORAL_TABLET | Freq: Once | ORAL | Status: AC
  Administered 2024-01-16: 2 via ORAL
  Filled 2024-01-16: qty 2

## 2024-01-16 MED ORDER — DEXAMETHASONE SOD PHOSPHATE PF 10 MG/ML IJ SOLN
10.0000 mg | Freq: Once | INTRAMUSCULAR | Status: AC
  Administered 2024-01-16: 10 mg via INTRAVENOUS

## 2024-01-16 MED ORDER — CEPHALEXIN 500 MG PO CAPS: 500.0000 mg | ORAL_CAPSULE | Freq: Two times a day (BID) | ORAL | 0 refills | Status: AC

## 2024-01-16 NOTE — ED Provider Notes (Signed)
 [place] [facility] Provider Note   CSN: [id number] Arrival date & time: 01/16/24  1050     Patient presents with: Abdominal Pain   Amy Mathis is a 43 y.o. female.   Patient with history of lupus, hysterectomy, chronic abdominal pain presents with worsening left lower abdominal pain onset last night.  History of ovarian cyst.  Patient denies nausea vomiting diarrhea fever.  Patient has multiple allergy history.  Mild dysuria at times.  The history is provided by the patient.  Abdominal Pain Associated symptoms: no chest pain, no chills, no dysuria, no fever, no shortness of breath, no vaginal bleeding, no vaginal discharge and no vomiting        Prior to Admission medications   Medication Sig Start Date End Date Taking? Authorizing Provider  cephALEXin (KEFLEX) 500 MG capsule Take 1 capsule (500 mg total) by mouth 2 (two) times daily. 01/16/24  Yes Chasitee Zenker, MD  WEGOVY 0.5 MG/0.5ML SOAJ SQ injection Inject 0.5 mg into the skin once a week. 12/15/23  Yes [provider]  ciprofloxacin  (CIPRO ) 500 MG tablet Take 0.5 tablets (250 mg total) by mouth every 12 (twelve) hours. 06/15/23   Keith, Kayla N, PA-C  lidocaine  (LIDODERM ) 5 % Place 1 patch onto the skin daily. Remove & Discard patch within 12 hours or as directed by MD 09/11/23   Theotis Cameron HERO, PA-C  methocarbamol  (ROBAXIN ) 500 MG tablet Take 1 tablet (500 mg total) by mouth 2 (two) times daily. 09/06/23   Bauer, Collin S, PA-C  ondansetron  (ZOFRAN ) 4 MG tablet Take 1 tablet (4 mg total) by mouth every 6 (six) hours. 09/06/23   Bauer, Collin S, PA-C  oxyCODONE -acetaminophen  (PERCOCET) 10-325 MG tablet Take 1 tablet by mouth every 4 (four) hours as needed. **every 4-6 hours**    [provider]  pantoprazole  (PROTONIX ) 20 MG tablet Take 1 tablet (20 mg total) by mouth daily. 09/11/23   Theotis Cameron HERO, PA-C  promethazine  (PHENERGAN ) 25 MG tablet Take 1 tablet (25 mg  total) by mouth every 6 (six) hours as needed for nausea or vomiting. 09/14/22   Desiderio Chew, PA-C    Allergies: Morphine, Morphine and codeine, Penicillin g, Penicillins, Tramadol, Meperidine hcl, and Nsaids    Review of Systems  Constitutional:  Negative for chills and fever.  HENT:  Negative for congestion.   Eyes:  Negative for visual disturbance.  Respiratory:  Negative for shortness of breath.   Cardiovascular:  Negative for chest pain.  Gastrointestinal:  Positive for abdominal pain. Negative for vomiting.  Genitourinary:  Negative for dysuria, flank pain, vaginal bleeding and vaginal discharge.  Musculoskeletal:  Negative for back pain, neck pain and neck stiffness.  Skin:  Negative for rash.  Neurological:  Negative for light-headedness and headaches.    Updated Vital Signs BP (!) 128/54   Pulse 89   Temp 98.3 F (36.8 C) (Oral)   Resp (!) 21   Wt (!) 155.1 kg   SpO2 97%   BMI 49.07 kg/m   Physical Exam Vitals and nursing note reviewed.  Constitutional:      General: She is not in acute distress.    Appearance: She is well-developed.  HENT:     Head: Normocephalic and atraumatic.     Mouth/Throat:     Mouth: Mucous membranes are moist.  Eyes:     General:        Right eye: No discharge.  Left eye: No discharge.     Conjunctiva/sclera: Conjunctivae normal.  Neck:     Trachea: No tracheal deviation.  Cardiovascular:     Rate and Rhythm: Normal rate and regular rhythm.     Heart sounds: No murmur heard. Pulmonary:     Effort: Pulmonary effort is normal.     Breath sounds: Normal breath sounds.  Abdominal:     General: There is no distension.     Palpations: Abdomen is soft.     Tenderness: There is abdominal tenderness in the left lower quadrant. There is no guarding.  Musculoskeletal:     Cervical back: Normal range of motion and neck supple. No rigidity.  Skin:    General: Skin is warm.     Capillary Refill: Capillary refill takes less than 2  seconds.     Findings: No rash.  Neurological:     General: No focal deficit present.     Mental Status: She is alert.     Cranial Nerves: No cranial nerve deficit.  Psychiatric:        Mood and Affect: Mood normal.     (all labs ordered are listed, but only abnormal results are displayed) Labs Reviewed  COMPREHENSIVE METABOLIC PANEL WITH GFR - Abnormal; Notable for the following components:      Result Value   Total Protein 8.2 (*)    All other components within normal limits  URINALYSIS, ROUTINE W REFLEX MICROSCOPIC - Abnormal; Notable for the following components:   APPearance TURBID (*)    Glucose, UA   (*)    Value: TEST NOT REPORTED DUE TO COLOR INTERFERENCE OF URINE PIGMENT   Hgb urine dipstick   (*)    Value: TEST NOT REPORTED DUE TO COLOR INTERFERENCE OF URINE PIGMENT   Bilirubin Urine   (*)    Value: TEST NOT REPORTED DUE TO COLOR INTERFERENCE OF URINE PIGMENT   Ketones, ur   (*)    Value: TEST NOT REPORTED DUE TO COLOR INTERFERENCE OF URINE PIGMENT   Protein, ur   (*)    Value: TEST NOT REPORTED DUE TO COLOR INTERFERENCE OF URINE PIGMENT   Nitrite   (*)    Value: TEST NOT REPORTED DUE TO COLOR INTERFERENCE OF URINE PIGMENT   Leukocytes,Ua   (*)    Value: TEST NOT REPORTED DUE TO COLOR INTERFERENCE OF URINE PIGMENT   All other components within normal limits  URINALYSIS, MICROSCOPIC (REFLEX) - Abnormal; Notable for the following components:   Bacteria, UA MANY (*)    All other components within normal limits  URINE CULTURE  LIPASE, BLOOD  CBC    EKG: None  Radiology: No results found.   Procedures   Medications Ordered in the ED  fentaNYL  (SUBLIMAZE ) injection 75 mcg (has no administration in time range)  dexamethasone  (DECADRON ) injection 10 mg (has no administration in time range)  oxyCODONE -acetaminophen  (PERCOCET/ROXICET) 5-325 MG per tablet 2 tablet (2 tablets Oral Given 01/16/24 1222)                                    Medical Decision  Making Amount and/or Complexity of Data Reviewed Labs: ordered.  Risk Prescription drug management.   Patient with known lupus and chronic abdominal pain presents with acute worsening left lower quadrant.  Medical records reviewed patient was seen for similar on 10/15 and results unremarkable no left lower abnormalities.  Blood work overall  reassuring normal hemoglobin normal white blood cell count, electrolytes unremarkable.  Urinalysis reviewed difficult to delineate due to color abnormalities however patient does have many bacteria and dysuria/pressure.  Pain worsened on reassessment.  Plan for Decadron  to help with component of nephritis/lupus, fentanyl  for pain and outpatient treatment with Keflex.  Patient comfortable plan.     Final diagnoses:  Left lower quadrant abdominal pain  Acute cystitis with hematuria    ED Discharge Orders          Ordered    cephALEXin (KEFLEX) 500 MG capsule  2 times daily        01/16/24 1450               Marquisa Salih, MD 01/16/24 1451

## 2024-01-16 NOTE — ED Notes (Signed)
 Unable to review d/c paperwork with pt as she is no longer visualized in dept. Pt was ready for d/c, just did not receive printed AVS.

## 2024-01-16 NOTE — ED Triage Notes (Signed)
 Left lower abdominal pain that started last night. Hx of ovarian cyst. Denies N/V/D

## 2024-01-16 NOTE — Discharge Instructions (Addendum)
 Follow-up with your local physicians.  Use Tylenol  every 4 hours or ibuprofen every 6 as needed for pain. Follow-up urine culture results with your physician.  Take antibiotics as directed for 7 days.

## 2024-01-16 NOTE — ED Notes (Signed)
Lab notified to add-on urine culture to previously collected urine sample.  

## 2024-01-17 LAB — URINE CULTURE: Culture: NO GROWTH

## 2024-02-08 ENCOUNTER — Encounter (HOSPITAL_BASED_OUTPATIENT_CLINIC_OR_DEPARTMENT_OTHER): Payer: Self-pay | Admitting: Emergency Medicine

## 2024-02-08 ENCOUNTER — Other Ambulatory Visit: Payer: Self-pay

## 2024-02-08 ENCOUNTER — Emergency Department (HOSPITAL_BASED_OUTPATIENT_CLINIC_OR_DEPARTMENT_OTHER)

## 2024-02-08 ENCOUNTER — Emergency Department (HOSPITAL_BASED_OUTPATIENT_CLINIC_OR_DEPARTMENT_OTHER)
Admission: EM | Admit: 2024-02-08 | Discharge: 2024-02-08 | Disposition: A | Discharge status: Home / Self Care | Attending: Emergency Medicine | Admitting: Emergency Medicine

## 2024-02-08 DIAGNOSIS — S6992XA Unspecified injury of left wrist, hand and finger(s), initial encounter: Secondary | ICD-10-CM | POA: Diagnosis present

## 2024-02-08 DIAGNOSIS — W010XXA Fall on same level from slipping, tripping and stumbling without subsequent striking against object, initial encounter: Secondary | ICD-10-CM | POA: Diagnosis not present

## 2024-02-08 DIAGNOSIS — S63502A Unspecified sprain of left wrist, initial encounter: Secondary | ICD-10-CM | POA: Diagnosis not present

## 2024-02-08 MED ORDER — ACETAMINOPHEN 500 MG PO TABS
1000.0000 mg | ORAL_TABLET | Freq: Once | ORAL | Status: AC
  Administered 2024-02-08: 1000 mg via ORAL
  Filled 2024-02-08: qty 2

## 2024-02-08 NOTE — ED Provider Notes (Signed)
 [place] [facility] Provider Note   CSN: [id number] Arrival date & time: 02/08/24  1208     Patient presents with: Amy   [name] is a 43 y.o. female.   43 year old female here today after a slip and fall on an outstretched hand.  She is bringing her mother to her here for Thanksgiving and slipped on some leaves.  She endorsing pain in her left wrist.  No head strike or loss of consciousness.   Fall       Prior to Admission medications   Medication Sig Start Date End Date Taking? Authorizing Provider  cephALEXin  (KEFLEX ) 500 MG capsule Take 1 capsule (500 mg total) by mouth 2 (two) times daily. 01/16/24   Tonia Chew, MD  ciprofloxacin  (CIPRO ) 500 MG tablet Take 0.5 tablets (250 mg total) by mouth every 12 (twelve) hours. 06/15/23   Keith, Kayla N, PA-C  lidocaine  (LIDODERM ) 5 % Place 1 patch onto the skin daily. Remove & Discard patch within 12 hours or as directed by MD 09/11/23   Theotis Cameron HERO, PA-C  methocarbamol  (ROBAXIN ) 500 MG tablet Take 1 tablet (500 mg total) by mouth 2 (two) times daily. 09/06/23   Bauer, Collin S, PA-C  ondansetron  (ZOFRAN ) 4 MG tablet Take 1 tablet (4 mg total) by mouth every 6 (six) hours. 09/06/23   Bauer, Collin S, PA-C  oxyCODONE -acetaminophen  (PERCOCET) 10-325 MG tablet Take 1 tablet by mouth every 4 (four) hours as needed. **every 4-6 hours**    [provider]  pantoprazole  (PROTONIX ) 20 MG tablet Take 1 tablet (20 mg total) by mouth daily. 09/11/23   Theotis Cameron HERO, PA-C  promethazine  (PHENERGAN ) 25 MG tablet Take 1 tablet (25 mg total) by mouth every 6 (six) hours as needed for nausea or vomiting. 09/14/22   Desiderio Chew, PA-C  WEGOVY 0.5 MG/0.5ML SOAJ SQ injection Inject 0.5 mg into the skin once a week. 12/15/23   [provider]    Allergies: Morphine, Morphine and codeine, Penicillin g, Penicillins, Tramadol, Meperidine hcl, and Nsaids    Review of Systems  Updated  Vital Signs BP (!) 157/88   Pulse (!) 110   Temp 98.7 F (37.1 C) (Oral)   Resp 20   Wt (!) 159.7 kg   SpO2 100%   BMI 50.51 kg/m   Physical Exam Vitals and nursing note reviewed.  HENT:     Head: Normocephalic and atraumatic.  Musculoskeletal:        General: Swelling and signs of injury present. No deformity.     Comments: Pain and tenderness to the distal left radius.  No deformity.  Mild snuffbox tenderness.  Skin:    General: Skin is warm.  Neurological:     Mental Status: She is alert.     Comments: Intact median, radial, ulnar nerve sensation and function in the left hand     (all labs ordered are listed, but only abnormal results are displayed) Labs Reviewed - No data to display  EKG: None  Radiology: DG Wrist Complete Left Result Date: 02/08/2024 CLINICAL DATA:  fall, injury EXAM: LEFT WRIST - COMPLETE 3+ VIEW COMPARISON:  February 05, 2023 FINDINGS: No acute fracture or dislocation. Joint spaces and alignment are relatively maintained. No area of erosion or osseous destruction. No unexpected radiopaque foreign body. Soft tissues are unremarkable. IMPRESSION: 1. No acute fracture or dislocation. If there is a persistent clinical concern for scaphoid fracture, recommend immobilization and follow-up radiographs in 2 weeks  versus MRI. Electronically Signed   By: Corean Salter M.D.   On: 02/08/2024 12:57     Procedures   Medications Ordered in the ED  acetaminophen  (TYLENOL ) tablet 1,000 mg (has no administration in time range)                                    Medical Decision Making 43 year old female here today with left wrist pain.  Differential diagnoses include wrist fracture, wrist sprain, scaphoid fracture.  Plan -patient with a prior history of a scaphoid fracture in the same hand.  Does have some mild tenderness there.  Plain films negative for obvious fracture, will place in wrist splint with thumb abduction.  Will provide hand  follow-up.  Neurovascularly intact in the hand, no other injuries identified on exam.  This patient's health care is complicated by the following social determinants of health-lack of access to primary care.  Amount and/or Complexity of Data Reviewed Radiology: ordered.  Risk OTC drugs.        Final diagnoses:  Sprain of left wrist, unspecified location, initial encounter    ED Discharge Orders     None          Mannie Fairy DASEN, DO 02/08/24 1326

## 2024-02-08 NOTE — Discharge Instructions (Signed)
 Your x-ray today was negative.  There is a chance that you could have a small fracture in your scaphoid bone, or your wrist.  We have placed you in a splint.  Please keep this on until you follow-up with orthopedic surgery.  Included in your discharge paperwork is a telephone number for hand surgeon.  I recommend that you call them for follow-up appointment early next week.  You may take Tylenol  Motrin at home for pain.  Return to the emergency room for new or worsening pain, inability use your hand.

## 2024-02-08 NOTE — ED Triage Notes (Signed)
 Pt endorses mechanical fall 1 hr pta, c/o LT wrist pain. Swelling noted Tylenol  pta

## 2024-02-29 NOTE — ED Provider Notes (Addendum)
 Patient placed in First Look pathway, seen and evaluated for chief complaint of abnormal outpatient labs.  Patient states she has been having bodyaches and chills for the last 2 days and has been being treated for UTI.  Pertinent exam findings include resting comfortably.   Based on initial evaluation, labs are currently indicated and radiology studies are not currently indicated as allowed for current processes and treatments as applicable in a triage setting. Patient counseled on process, plan, and necessity for staying for completing the evaluation.    Note By: Toribio Clam, PA-C 10:58 AM   [facility] Emergency Department Emergency Department Provider Note  This document was created using the aid of voice recognition Dragon dictation software  ____________________________________________  Time seen: 8:43 PM  I have reviewed the triage vital signs and the nursing notes.   History   Chief Complaint Abnormal Lab   HPI  Amy Mathis is a 43 y.o. female with the chief complaint of right flank pain. Flank pain radiates to the right lower abdomen. Right flank pain began two days ago.  The patient reports that two days ago she had leukocytosis. The patient is not currently on an antibiotic. The patient has a history of left adnexal cyst. The patient reports history of cholecystectomy, hysterectomy, and bilateral oophorectomy. 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     Physical Exam   VITAL SIGNS:   ED Triage Vitals [02/29/24 1055]  Temp 98.1 F (36.7 C)  Heart Rate 81  Resp (!) 22  BP 144/65  MAP (mmHg) 91  SpO2 95 %  O2 Device None (Room air)  O2 Flow Rate (L/min)   Weight (!) 160 kg (352 lb)    Constitutional: Alert and oriented. Well appearing and in no distress. Eyes: Conjunctivae are normal. ENT      Head: Normocephalic and atraumatic.      Neck: No stridor. Cardiovascular: Normal  rate and rhythm. Heart rate: 69. Blood pressure 129/63. Respiratory: Normal respiratory effort. Breath sounds are normal. Oxygen saturation 100% on room air. Gastrointestinal: No abdominal distension. There is abdominal tenderness to palpation. There is right CVA tenderness to percussion. Musculoskeletal: No deformities noted. No lower extremity swelling.  Neurologic: Normal speech and language. No gross focal neurologic deficits are appreciated. Psychiatric: Mood and affect are normal.   EKG   According to my interpretation the ECG shows None  Radiology   All X-rays, CTs, and MRIs interpreted by radiologist and interpretation reviewed by me.  Procedures   Procedure(s) performed: None.  Pertinent labs & imaging results that were available during my care of the patient were reviewed by me and considered in my medical decision making (see chart for details).  Total critical care time was 0 minutes. This was spent providing cardiovascular or respiratory resuscitation in a critically ill patient.    ED Clinical Impression   1. Acute pyelonephritis Active  2. Gross hematuria Active  3. Candiduria Active    Medical Decision Making: Initial Impression, ED Course, Assessment and Plan   Per my interpretation the bedside cardiac monitoring shows: NSR with rate of 72.  The following chart(s) was reviewed: Urine culture result from 02/14/2024 reviewed positive for Klebsiella pneumonia with multiple sensitivities.  Medical Decision Making 43 year old female presents with flank pain. Differential diagnosis included nephrolithiasis/ureterolithiasis, pyelonephritis, renal mass/malignancy, rib fracture, herpes zoster, musculoskeletal strain, abdominal aortic dissection/aneurysm, acute pulmonary embolism, pancreatitis, acute cholecystitis, adnexal cyst/mass. CT abdomen and  pelvis considered and was ordered. Workup here shows hematuria, bacteriuria, candiduria, status post hysterectomy and  cholecystectomy, left adnexal prominence.  No hydronephrosis.  No nephritis, no pyelonephritis. I do not see any laboratory or tissue biopsy evidence of systemic lupus erythematosus or lupus nephritis.  I have considered treating the patient empirically for SLE and lupus nephritis however given the lack of evidence of nephritis on CT and the unclarity regarding this patient's diagnosis I am going to leave that treatment up to the primary care provider, rheumatologist, nephrologist, and urologist.  I will treat empirically for pyelonephritis with Omnicef twice daily for 10 days.  The patient was given ceftriaxone here.  I will give 1 dose of fluconazole  here for Candida vaginitis.  I discussed this dosage with pharmacy given the concern for QTc prolongation with hydroxychloroquine. Josetta Cera states we are OK to give one dose of Fluconazole  here. Will give 200 mg one time dose.  The patient will be referred to rheumatology and nephrology.  The patient understands the importance of this.  The patient has an appointment with urology soon she states.  I have instructed the patient that she will need a cystoscopy and ureteroscopy. I have written for Bactrim  for 7 days. Based on the home medications (Hydroxychloroquine) and penicillin allergy I believe this would be the safest medication. I have recommended repeat labs in 1 week. I have also recommended urgent outpatient pelvis MRI for left adnexal fullness seen on CT.  These recommendations were communicated verbally via telephone after confirming patient identifiers with the patient.   Problems Addressed: Acute pyelonephritis: complicated acute illness or injury Candiduria: complicated acute illness or injury Gross hematuria: complicated acute illness or injury  Amount and/or Complexity of Data Reviewed Labs: ordered. Radiology: ordered.  Risk Prescription drug management.    Clinical Complexity  Patient's presentation is most consistent with acute  presentation with potential threat to life or bodily function.  Patient's history of systemic lupus erythematosus, IBD increases the complexity of managing their  presentation with abnormal labs.    Provider time spent in patient care today, inclusive of but not limited to clinical reassessment, review of diagnostic studies, and discharge preparation, was greater than 30 minutes.

## 2024-03-24 ENCOUNTER — Emergency Department (HOSPITAL_BASED_OUTPATIENT_CLINIC_OR_DEPARTMENT_OTHER)
Admission: EM | Admit: 2024-03-24 | Discharge: 2024-03-25 | Disposition: A | Discharge status: Home / Self Care | Attending: Emergency Medicine | Admitting: Emergency Medicine

## 2024-03-24 ENCOUNTER — Other Ambulatory Visit: Payer: Self-pay

## 2024-03-24 DIAGNOSIS — D72829 Elevated white blood cell count, unspecified: Secondary | ICD-10-CM | POA: Diagnosis not present

## 2024-03-24 DIAGNOSIS — Z79899 Other long term (current) drug therapy: Secondary | ICD-10-CM | POA: Insufficient documentation

## 2024-03-24 DIAGNOSIS — R1031 Right lower quadrant pain: Secondary | ICD-10-CM | POA: Diagnosis not present

## 2024-03-24 DIAGNOSIS — R310 Gross hematuria: Secondary | ICD-10-CM | POA: Diagnosis present

## 2024-03-24 LAB — CBC WITH DIFFERENTIAL/PLATELET
Abs Immature Granulocytes: 0.05 K/uL (ref 0.00–0.07)
Basophils Absolute: 0 K/uL (ref 0.0–0.1)
Basophils Relative: 0 %
Eosinophils Absolute: 0 K/uL (ref 0.0–0.5)
Eosinophils Relative: 0 %
HCT: 38.8 % (ref 36.0–46.0)
Hemoglobin: 14 g/dL (ref 12.0–15.0)
Immature Granulocytes: 1 %
Lymphocytes Relative: 10 %
Lymphs Abs: 1 K/uL (ref 0.7–4.0)
MCH: 29.5 pg (ref 26.0–34.0)
MCHC: 36.1 g/dL — ABNORMAL HIGH (ref 30.0–36.0)
MCV: 81.9 fL (ref 80.0–100.0)
Monocytes Absolute: 0 K/uL — ABNORMAL LOW (ref 0.1–1.0)
Monocytes Relative: 0 %
Neutro Abs: 9.8 K/uL — ABNORMAL HIGH (ref 1.7–7.7)
Neutrophils Relative %: 89 %
Platelets: 391 K/uL (ref 150–400)
RBC: 4.74 MIL/uL (ref 3.87–5.11)
RDW: 14.2 % (ref 11.5–15.5)
WBC: 11.2 K/uL — ABNORMAL HIGH (ref 4.0–10.5)
nRBC: 0 % (ref 0.0–0.2)

## 2024-03-24 LAB — URINALYSIS, ROUTINE W REFLEX MICROSCOPIC

## 2024-03-24 LAB — COMPREHENSIVE METABOLIC PANEL WITH GFR
ALT: 20 U/L (ref 0–44)
AST: 26 U/L (ref 15–41)
Albumin: 4.4 g/dL (ref 3.5–5.0)
Alkaline Phosphatase: 67 U/L (ref 38–126)
Anion gap: 14 (ref 5–15)
BUN: 6 mg/dL (ref 6–20)
CO2: 24 mmol/L (ref 22–32)
Calcium: 9.7 mg/dL (ref 8.9–10.3)
Chloride: 102 mmol/L (ref 98–111)
Creatinine, Ser: 0.72 mg/dL (ref 0.44–1.00)
GFR, Estimated: >60 mL/min
Glucose, Bld: 160 mg/dL — ABNORMAL HIGH (ref 70–99)
Potassium: 3.9 mmol/L (ref 3.5–5.1)
Sodium: 140 mmol/L (ref 135–145)
Total Bilirubin: 0.4 mg/dL (ref 0.0–1.2)
Total Protein: 8 g/dL (ref 6.5–8.1)

## 2024-03-24 LAB — CK: Total CK: 77 U/L (ref 38–234)

## 2024-03-24 LAB — LIPASE, BLOOD: Lipase: 23 U/L (ref 11–51)

## 2024-03-24 LAB — HCG, SERUM, QUALITATIVE: Preg, Serum: NEGATIVE

## 2024-03-24 LAB — URINALYSIS, MICROSCOPIC (REFLEX)

## 2024-03-24 MED ORDER — HYDROMORPHONE HCL 1 MG/ML IJ SOLN
0.5000 mg | Freq: Once | INTRAMUSCULAR | Status: AC
  Administered 2024-03-25: 0.5 mg via INTRAVENOUS
  Filled 2024-03-24: qty 1

## 2024-03-24 MED ORDER — OXYCODONE-ACETAMINOPHEN 5-325 MG PO TABS
2.0000 | ORAL_TABLET | Freq: Once | ORAL | Status: AC
  Administered 2024-03-24: 2 via ORAL
  Filled 2024-03-24: qty 2

## 2024-03-24 NOTE — ED Triage Notes (Signed)
 Pt arrives with c/o hematuria, right side flank pain, and lower ABD pain. Pt reports fevers. Pt denies dysuria. Pt was diagnosed with pyelonephritis a couple of weeks ago.

## 2024-03-24 NOTE — ED Provider Notes (Signed)
 " [place] [facility] Provider Note   CSN: [id number] Arrival date & time: 03/24/24  1805     Patient presents with: Hematuria   Amy Mathis is a 44 y.o. female.  44 year old female presents to the emergency department with complaints of hematuria, malaise, and right flank pain x 1 month.  Patient was diagnosed with pyelonephritis and had recurrent UTIs following the diagnosis.  Patient has been seen at another emergency department with no concerning findings and has been sent home on Bactrim .  Patient went to follow-up at primary care today and advised her to return to the hospital for further evaluation due to continued pain and gross hematuria.  Patient did report some fevers yesterday but no fevers today.  Patient has been taking Tylenol  and oxycodone  at home for pain.  Patient has significant history of lupus and IBS.  She has had a cholecystectomy, hysterectomy, and oophorectomy.     Prior to Admission medications  Medication Sig Start Date End Date Taking? Authorizing Provider  lidocaine  (LIDODERM ) 5 % Place 1 patch onto the skin daily. Remove & Discard patch within 12 hours or as directed by MD 03/25/24  Yes Myriam Fonda RAMAN, PA-C  cephALEXin  (KEFLEX ) 500 MG capsule Take 1 capsule (500 mg total) by mouth 2 (two) times daily. 01/16/24   Tonia Fonda, MD  ciprofloxacin  (CIPRO ) 500 MG tablet Take 0.5 tablets (250 mg total) by mouth every 12 (twelve) hours. 06/15/23   Keith, Kayla N, PA-C  lidocaine  (LIDODERM ) 5 % Place 1 patch onto the skin daily. Remove & Discard patch within 12 hours or as directed by MD 09/11/23   Theotis Cameron HERO, PA-C  methocarbamol  (ROBAXIN ) 500 MG tablet Take 1 tablet (500 mg total) by mouth 2 (two) times daily. 09/06/23   Bauer, Collin S, PA-C  ondansetron  (ZOFRAN ) 4 MG tablet Take 1 tablet (4 mg total) by mouth every 6 (six) hours. 09/06/23   Bauer, Collin S, PA-C  oxyCODONE -acetaminophen  (PERCOCET) 10-325 MG tablet Take 1  tablet by mouth every 4 (four) hours as needed. **every 4-6 hours**    [provider]  pantoprazole  (PROTONIX ) 20 MG tablet Take 1 tablet (20 mg total) by mouth daily. 09/11/23   Theotis Cameron HERO, PA-C  promethazine  (PHENERGAN ) 25 MG tablet Take 1 tablet (25 mg total) by mouth every 6 (six) hours as needed for nausea or vomiting. 09/14/22   Desiderio Fonda, PA-C  WEGOVY 0.5 MG/0.5ML SOAJ SQ injection Inject 0.5 mg into the skin once a week. 12/15/23   [provider]    Allergies: Morphine, Morphine and codeine, Penicillin g, Penicillins, Tramadol, Meperidine hcl, and Nsaids    Review of Systems  Genitourinary:  Positive for hematuria.  All other systems reviewed and are negative.   Updated Vital Signs BP 122/73   Pulse 95   Temp 98.4 F (36.9 C)   Resp 18   SpO2 95%   Physical Exam Vitals and nursing note reviewed.  Constitutional:      General: She is not in acute distress.    Appearance: Normal appearance. She is not ill-appearing.  HENT:     Head: Normocephalic and atraumatic.     Nose: Nose normal.  Eyes:     Extraocular Movements: Extraocular movements intact.     Conjunctiva/sclera: Conjunctivae normal.     Pupils: Pupils are equal, round, and reactive to light.  Cardiovascular:     Rate and Rhythm: Normal rate.  Pulmonary:  Effort: Pulmonary effort is normal. No respiratory distress.     Breath sounds: Normal breath sounds.  Abdominal:     General: There is no distension.     Tenderness: There is right CVA tenderness.     Comments: Patient has some suprapubic tenderness as well.  Musculoskeletal:        General: No swelling. Normal range of motion.     Cervical back: Normal range of motion.  Skin:    General: Skin is warm.     Capillary Refill: Capillary refill takes less than 2 seconds.  Neurological:     General: No focal deficit present.     Mental Status: She is alert.  Psychiatric:        Mood and Affect: Mood normal.         Behavior: Behavior normal.     (all labs ordered are listed, but only abnormal results are displayed) Labs Reviewed  URINALYSIS, ROUTINE W REFLEX MICROSCOPIC - Abnormal; Notable for the following components:      Result Value   Color, Urine BROWN (*)    APPearance TURBID (*)    Glucose, UA   (*)    Value: TEST NOT REPORTED DUE TO COLOR INTERFERENCE OF URINE PIGMENT   Hgb urine dipstick   (*)    Value: TEST NOT REPORTED DUE TO COLOR INTERFERENCE OF URINE PIGMENT   Bilirubin Urine   (*)    Value: TEST NOT REPORTED DUE TO COLOR INTERFERENCE OF URINE PIGMENT   Ketones, ur   (*)    Value: TEST NOT REPORTED DUE TO COLOR INTERFERENCE OF URINE PIGMENT   Protein, ur   (*)    Value: TEST NOT REPORTED DUE TO COLOR INTERFERENCE OF URINE PIGMENT   Nitrite   (*)    Value: TEST NOT REPORTED DUE TO COLOR INTERFERENCE OF URINE PIGMENT   Leukocytes,Ua   (*)    Value: TEST NOT REPORTED DUE TO COLOR INTERFERENCE OF URINE PIGMENT   All other components within normal limits  CBC WITH DIFFERENTIAL/PLATELET - Abnormal; Notable for the following components:   WBC 11.2 (*)    MCHC 36.1 (*)    Neutro Abs 9.8 (*)    Monocytes Absolute 0.0 (*)    All other components within normal limits  URINALYSIS, MICROSCOPIC (REFLEX) - Abnormal; Notable for the following components:   Bacteria, UA MANY (*)    All other components within normal limits  COMPREHENSIVE METABOLIC PANEL WITH GFR - Abnormal; Notable for the following components:   Glucose, Bld 160 (*)    All other components within normal limits  HCG, SERUM, QUALITATIVE  CK  LIPASE, BLOOD    EKG: None  Radiology: No results found.   Procedures   Medications Ordered in the ED  oxyCODONE -acetaminophen  (PERCOCET/ROXICET) 5-325 MG per tablet 2 tablet (2 tablets Oral Given 03/24/24 2250)  HYDROmorphone  (DILAUDID ) injection 0.5 mg (0.5 mg Intravenous Given 03/25/24 0002)  iohexol  (OMNIPAQUE ) 300 MG/ML solution 125 mL (125 mLs Intravenous Contrast Given  03/25/24 0029)    44 y.o. female presents to the ED with complaints of hematuria, fever, UTI-like symptoms, right flank pain,   The differential diagnosis includes UTI, pyelonephritis, sepsis, (Ddx)  On arrival pt is nontoxic, vitals unremarkable. Exam significant for pain to palpation in the right flank and suprapubic area.  I ordered medication oxycodone  and dilaudid  for pain  Lab Tests:  CMP lipase CBC hCG UA CMP unremarkable, CBC has mild leukocytosis at 11.2 no other concerning findings.  CK unremarkable hCG negative.  UA significant for brown color and evidence of white and red blood cells.  Patient was recently placed on Bactrim .  Imaging Studies ordered:  I ordered imaging studies which included CT abdomen pelvis.  CT was unremarkable for any acute abnormalities.  ED Course:   Patient is sitting comfortably in ED bed in no acute distress nontoxic-appearing on initial evaluation.  Patient has been seen several times in emergency department and reports she still feels the same and is concerned for continued UTI and recurrence of pyelonephritis.  Patient has had continued hematuria as well.  Patient does not fit SIRS criteria at this time will obtain blood work and get CT abdomen pelvis.  Patient has been on multiple courses of antibiotics and just started on Bactrim  yesterday which was susceptible per urine culture.  CT is unremarkable for any pyelonephritis.  Lab work is overall reassuring.  Patient feels much better after administration of pain medicine in ED.  Patient will be given lidocaine  patches for comfort patient has oxycodone  at home and muscle relaxers.  Patient also reports she has a cystoscopy later this month with the urologist.  Patient is requesting another urology referral, will be provided with 1.  Patient was advised of all findings and advised to follow-up with urology and continue to taking Bactrim  as prescribed by other emergency department.  Patient agreed to treatment  plan and is comfortable discharge at this time.    Portions of this note were generated with Scientist, clinical (histocompatibility and immunogenetics). Dictation errors may occur despite best attempts at proofreading.   Final diagnoses:  Gross hematuria    ED Discharge Orders          Ordered    lidocaine  (LIDODERM ) 5 %  Every 24 hours        03/25/24 0103               Myriam Fonda RAMAN, PA-C 03/27/24 9178    Doretha Folks, MD 03/27/24 1329  "

## 2024-03-25 ENCOUNTER — Emergency Department (HOSPITAL_BASED_OUTPATIENT_CLINIC_OR_DEPARTMENT_OTHER)

## 2024-03-25 MED ORDER — IOHEXOL 300 MG/ML  SOLN
125.0000 mL | Freq: Once | INTRAMUSCULAR | Status: AC | PRN
  Administered 2024-03-25: 125 mL via INTRAVENOUS

## 2024-03-25 MED ORDER — LIDOCAINE 5 % EX PTCH
1.0000 | MEDICATED_PATCH | Freq: Once | CUTANEOUS | Status: DC
  Administered 2024-03-25: 1 via TRANSDERMAL
  Filled 2024-03-25: qty 1

## 2024-03-25 MED ORDER — LIDOCAINE 5 % EX PTCH: 1.0000 | MEDICATED_PATCH | CUTANEOUS | 0 refills | Status: AC

## 2024-03-25 MED ORDER — HYDROMORPHONE HCL 1 MG/ML IJ SOLN: 0.5000 mg | Freq: Once | INTRAMUSCULAR | Status: DC

## 2024-03-25 NOTE — Discharge Instructions (Addendum)
 As discussed in the emergency department today your labs and imaging were overall reassuring.  Your urine is concerning for blood and infection.  You have already been started on antibiotic by another emergency department please continue to take that as prescribed.  I provided you with a urology office to call, please call them tomorrow to establish an appointment.  Monitor for any new or worsening concerning symptoms.  Symptoms included are high fevers, worsening pain, shortness of breath, chest pain, or fainting.  If any of these or other concerning symptoms arise please return to emergency department for further evaluation.
